# Patient Record
Sex: Female | Born: 1970 | Race: White | Hispanic: Yes | Marital: Married | Smoking: Never smoker
Health system: Southern US, Community
[De-identification: ages and names within clinical notes are randomized; demographics above are authoritative.]

## PROBLEM LIST (undated history)

## (undated) ENCOUNTER — Emergency Department (HOSPITAL_COMMUNITY): Admission: EM | Payer: Self-pay

## (undated) DIAGNOSIS — D259 Leiomyoma of uterus, unspecified: Secondary | ICD-10-CM

## (undated) HISTORY — PX: UTERINE FIBROID SURGERY: SHX826

---

## 2001-02-07 ENCOUNTER — Encounter: Admission: RE | Admit: 2001-02-07 | Discharge: 2001-02-07 | Payer: Self-pay | Admitting: Obstetrics & Gynecology

## 2007-01-23 ENCOUNTER — Other Ambulatory Visit: Admission: RE | Admit: 2007-01-23 | Discharge: 2007-01-23 | Payer: Self-pay | Admitting: Gynecology

## 2007-02-21 ENCOUNTER — Encounter (INDEPENDENT_AMBULATORY_CARE_PROVIDER_SITE_OTHER): Payer: Self-pay | Admitting: Specialist

## 2007-02-21 ENCOUNTER — Inpatient Hospital Stay (HOSPITAL_COMMUNITY): Admission: RE | Admit: 2007-02-21 | Discharge: 2007-02-23 | Payer: Self-pay | Admitting: Gynecology

## 2008-07-19 ENCOUNTER — Other Ambulatory Visit: Admission: RE | Admit: 2008-07-19 | Discharge: 2008-07-19 | Payer: Self-pay | Admitting: Gynecology

## 2008-07-19 ENCOUNTER — Ambulatory Visit: Payer: Self-pay | Admitting: Gynecology

## 2008-07-19 ENCOUNTER — Encounter: Payer: Self-pay | Admitting: Gynecology

## 2008-09-04 ENCOUNTER — Ambulatory Visit: Payer: Self-pay | Admitting: Gynecology

## 2011-03-26 NOTE — Discharge Summary (Signed)
Rebecca Moss, Rebecca Moss                ACCOUNT NO.:  [id number]   MEDICAL RECORD NO.:  [id number]          PATIENT TYPE:  INP   LOCATION:  9312                          [facility]ILITY[facility]   PHYSICIAN:  Juan H. Lily Peer, M.D.DATE OF BIRTH:  1971/03/21   DATE OF ADMISSION:  02/21/2007  DATE OF DISCHARGE:  02/23/2007                               DISCHARGE SUMMARY   HISTORY OF PRESENT ILLNESS:  The patient is a 40 year old gravida 0 with  symptomatic leiomyomatous uteri and primary infertility who underwent  abdominal myomectomy and lysis of pelvic adhesions and chromopertubation  on February 21, 2007. Fifteen fibroids were removed and the patient had  some moderate adhesions in the cul-de-sac and surface left ovarian  endometriosis.  The patient did well postoperatively on the first 24  hours. The patient had adequate urinary output. Her hemoglobin and  hematocrit were 11.1 and 31.6 respectively with a platelet count  265,000.  The patient was afebrile.  She started on liquid diet and was  advanced to a regular diet, was up and ambulating and passing flatus by  her second day and was ready to be discharged home and incision site was  intact.   FINAL DIAGNOSIS:  1. Leiomyomatous uteri.  2. Primary infertility.  3. Pelvic adhesions.  4. Left ovarian endometriosis.  5. Surgical anemia.   PROCEDURE PERFORMED:  1. Abdominal myomectomy.  2. Lysis of pelvic adhesions.  3. Ablation of left ovarian endometriotic implants.   DISPOSITION:  Follow-up; the patient was discharged home on the second  postoperative day. She was up and ambulating, tolerating regular diet  well and she had subcuticular stitches placed. She will return back to  the office in 2 weeks for postop visit.  She was given a prescription  for Tylox to take one p.o. q.4-6 h p.r.n. pain.  Also Motrin 800 mg take  one p.o. t.i.d. p.r.n. and she was instructed start taking iron  supplementation 1 tablet daily and will check a CBC at a  postop visit.  All instructions were provided in Spanish as well.      Juan H. Lily Peer, M.D.  Electronically Signed     JHF/MEDQ  D:  02/23/2007  T:  02/23/2007  Job:  [id number]

## 2011-03-26 NOTE — Op Note (Signed)
Rebecca Moss, Rebecca Moss                ACCOUNT NO.:  [id number]   MEDICAL RECORD NO.:  [id number]          PATIENT TYPE:  INP   LOCATION:  9312                          [facility]   PHYSICIAN:  Juan H. Lily Peer, M.D.DATE OF BIRTH:  1971/04/28   DATE OF PROCEDURE:  02/21/2007  DATE OF DISCHARGE:                               OPERATIVE REPORT   INDICATIONS FOR OPERATION:  A 40 year old gravida 0 with symptomatic  leiomyomata uteri and primary infertility.  The patient with prior HSG  in [place] 57 years ago, which demonstrated tubal patency.  The patient's  preoperative hemoglobin was 13.7, hematocrit 40.4.   SURGEON:  Juan H. Lily Peer, MD   FIRST ASSISTANT:  Colin Broach, MD   PREOPERATIVE DIAGNOSIS:  Symptomatic leiomyomata uteri.   POSTOPERATIVE DIAGNOSIS:  Symptomatic leiomyomata uteri.   PROCEDURES PERFORMED:  1. Abdominal myomectomy.  2. Lysis of pelvic adhesions.  3. Chromopertubation.   FINDINGS:  Patient with multiple intramural and subserosal leiomyomas.  Chromopertubation demonstrated bilateral tubal patency.  Right ovary  appeared normal.  The left ovary with surface endometriotic implant.  The cul-de-sac was slightly obliterated with pelvic adhesions.   DESCRIPTION OF OPERATION:  After the patient was adequately counseled,  she was taken to the operating room, where she underwent a successful  general endotracheal anesthesia.  She had received a gram of cefoxitin  for prophylaxis and had PSA stockings for DVT prophylaxis.  The legs  were placed initially in the high lithotomy position, whereby a Cohen  cannula was placed attached to a single-tooth tenaculum and attached to  a tubing with a syringe with methylene blue dye for the  chromopertubation.  The legs were then brought down.  A Foley catheter  was placed to monitor the patient's urinary output.  The abdomen had  been prepped and draped in the usual sterile fashion.  A Pfannenstiel  incision was made  2 cm above the symphysis pubis.  The incision was  carried down through the skin and subcutaneous tissue down to the rectus  fascia, whereby midline nick was made and the midline raphe was entered  cautiously.  O'Connor-O'Sullivan retractors were in place and  individually-wrapped gauze and plastic were used for packing to prevent  pelvic adhesions.  It was at this time that the multiple leiomyomas were  noted.  Pitressin was infiltrated on the serosa of the uterus in the  fundal aspect in the center between all the fibroids and staying away  from the tubal ostia.  Approximately 8 mL were infiltrated in a linear  fashion where the dissection was to be made.  The Pitressin dilution was  1 unit per 1 mL of saline (20 units of Pitressin in 20 mL of saline).  A  vertical incision was made at the fundus of the uterus and a total of 15  fibroids were enucleated and passed off the operative field.  The  intrauterine cavity was entered with removal one of the fibroids.  After  note, prior to the myomectomy chromopertubation had demonstrated  bilateral tubal patency.  Once the fibroids were removed, the  endometrial cavity that had been entered was reapproximated with  submucosal running stitch of 3-0 Vicryl suture and the muscularis was  closed with running stitches in several layers to approximate the  myometrium with 2-0 Vicryl suture.  The serosa was then closed with a  running stitch of 3-0 Vicryl suture.  The pelvic cavity was irrigated  with lactated Ringer's with heparin and after hemostasis was noted,  Interceed was placed over the incision site for additional prevention of  adhesions and, of note, the endometriotic implant on the left ovary had  been cauterized.  Sponge count and needle count were correct.  The  O'Connor-O'Sullivan retractor was removed.  The visceral peritoneum was  not reapproximated but the rectus fascia was closed with a running  stitch of 0 Vicryl suture and  subcutaneous bleeders were Bovie  cauterized.  The skin was reapproximated with a subcuticular stitch of 3-  0 Vicryl suture on a Keith needle, along with placement of half-inch  Steri-Strips and a pressure dressing.  The patient was extubated and  transferred to the recovery room with stable vital signs.  Blood loss  from the procedure was 300 mL.  IV fluids consisted of 1600  mL of  lactated Ringer's.  The urine output was 400 mL and clear.      Juan H. Lily Peer, M.D.  Electronically Signed     JHF/MEDQ  D:  02/21/2007  T:  02/21/2007  Job:  [id number]

## 2011-03-26 NOTE — H&P (Signed)
Rebecca Moss, Rebecca Moss                ACCOUNT NO.:  [id number]   MEDICAL RECORD NO.:  [id number]          PATIENT TYPE:  AMB   LOCATION:  SDC                           [facility]   PHYSICIAN:  Juan H. Lily Peer, M.D.DATE OF BIRTH:  1971/05/29   DATE OF ADMISSION:  DATE OF DISCHARGE:                              HISTORY & PHYSICAL   SURGERY SCHEDULED FOR 02/21/2007   CHIEF COMPLAINT:  Symptomatic leiomyomata uteri.   HISTORY:  The patient is a 40 year old gravida 0 who was seen in the  office as a new patient on March 17 for her annual exam that was long  overdue.  Her prior studies have been done in [place] and she states her  Pap smears have been normal.  Patient has had unprotected intercourse  for over the past 15 years.  She stated she had some form of infertility  evaluation in [place] whereby an HSG has been done and there was evidence  of tubal patency.  She states her cycles are every 25 days and during  her infertility evaluation and treatment it appears that she may have  received also intrauterine insemination and did not conceive.  Patient  during her examination was found to have a 14 week size uterus and for  this reason an ultrasound was ordered in the office which was done on  March 25 which demonstrated she had several fibroids.  One fibroid  measures 61 x 42 mm, 32 x 27 mm, 17 x 14 mm, 38 x 35 mm, 17 x 12 mm, 36  x 31 mm, 29 x 32 mm, 32 x 31 mm and 24 x 19 mm.  Degenerating myelomas  were noted.  The right ovary had a thick wall echo-free cyst measuring  23 x 18 mm and the left ovary had two thick wall cysts measuring 21 x 18  mm and 19 x 20 mm respectively with internal low-level echos.  Negative  color flow Doppler.  In total, patient had 9 fibroids scattered  throughout her uterus, two large ones anteriorly and the other large  ones more posteriorly located as well as several intramural.  Patient  was in the process of donating a unit of autologous blood.  When  she  went to donate her blood, they said her temperature was elevated and  when she came back to the office that day she was afebrile.  Since they  were only offering once a week, she could not go all the way [place], so  she was not able to donate autologous blood to have available in the  event that she were to need a blood transfusion.  She has been kept on  supplemental iron at home one tablet q daily.  Her last hemoglobin and  hematocrit were on March 28, demonstrating hemoglobin 13.7, hematocrit  of 40.4 with a platelet count of 325,000.  Patient scheduled to proceed  with an abdominal myomectomy before she proceeds with her fertility  desires.  I do believe that this is probably a major contributing factor  why she has been unable to conceive.  We will also do a  chromopertubation at time of her operation as well.   ALLERGIES:  She denies any allergies.   SOCIAL HISTORY:  She denies smoking or alcohol consumption.  Negative  family history of any malignancy reported.   MEDICATIONS:  She is currently on no medication.   PHYSICAL EXAMINATION:  GENERAL:  Patient weighs 143 lbs.  She is 5'3  tall.  HEENT:  Unremarkable.  NECK:  Supple.  Trachea midline.  No carotid bruits.  No thyromegaly.  LUNGS:  Clear to auscultation bilaterally without any rhonchi or  wheezes.  HEART:  Regular rate and rhythm.  No murmurs or gallops.  BREAST EXAM:  Not done.  ABDOMEN:  Soft, nontender without rebound or guarding.  PELVIC:  Bartholin, urethral and Skene's within normal limits.  VAGINA AND CERVIX:  No gross lesions on inspection.  Uterus 14 week size  and irregular.  RECTAL EXAM:  Unremarkable.   ASSESSMENT:  A 40 year old gravida 0 with symptomatic leiomyomata uteri  complaining of pressure sensation and primary infertility.  Scheduled to  undergo abdominal myomectomy.  The risks, benefits and pros and cons of  the operation were discussed including infection, although she will  receive  prophylactic antibiotic.  The risks for deep vein thrombosis and  subsequent pulmonary embolism were discussed as well.  Also if she were  to need a blood or a blood transfusion, she is fully aware of the  potential risks for anaphylactic reaction, hepatitis or AIDS and also  the potential risk for trauma to internal organs requiring corrective  surgery at that time and remotely in the event that is a life-saving  measure because of extreme hemorrhage, that she could potentially lose  her reproductive organs.  She is fully aware of this and accepts and we  will follow accordingly.   PLAN:  The patient is scheduled for abdominal myomectomy on Tuesday,  February 21, 2007 at 7:30 a.m. at [facility]      [place] H. Lily Peer, M.D.  Electronically Signed     JHF/MEDQ  D:  02/20/2007  T:  02/20/2007  Job:  [id number]

## 2012-08-11 ENCOUNTER — Ambulatory Visit: Payer: Self-pay | Admitting: Family Medicine

## 2012-08-11 VITALS — BP 110/68 | HR 71 | Temp 98.2°F | Resp 16 | Ht 61.0 in | Wt 161.0 lb

## 2012-08-11 DIAGNOSIS — Z124 Encounter for screening for malignant neoplasm of cervix: Secondary | ICD-10-CM

## 2012-08-11 DIAGNOSIS — R350 Frequency of micturition: Secondary | ICD-10-CM

## 2012-08-11 DIAGNOSIS — N898 Other specified noninflammatory disorders of vagina: Secondary | ICD-10-CM

## 2012-08-11 DIAGNOSIS — B373 Candidiasis of vulva and vagina: Secondary | ICD-10-CM

## 2012-08-11 LAB — POCT UA - MICROSCOPIC ONLY
Casts, Ur, LPF, POC: NEGATIVE
Crystals, Ur, HPF, POC: NEGATIVE
Mucus, UA: NEGATIVE

## 2012-08-11 LAB — POCT URINALYSIS DIPSTICK
Bilirubin, UA: NEGATIVE
Ketones, UA: NEGATIVE
pH, UA: 7

## 2012-08-11 LAB — POCT WET PREP WITH KOH

## 2012-08-11 MED ORDER — FLUCONAZOLE 150 MG PO TABS: 150.0000 mg | ORAL_TABLET | Freq: Once | ORAL | Status: DC

## 2012-08-11 NOTE — Progress Notes (Signed)
[facility] [address] [place] [address] [phone]  Date:  08/11/2012   Name:  Rebecca Moss   DOB:  1970-12-27   MRN:  [id number]  PCP:  No primary provider on file.    Chief Complaint: Gynecologic Exam   History of Present Illness:  Rebecca Moss is a 41 y.o. very pleasant female patient who presents with the following:  She would like to have a pap smear today.  She is concerned about vaginal itching and also has some discharge.  She also wants to be sure that she has not contracted any STI.  She is married but just wants to be sure.    LMP was 08/02/12.   She also recently had an ear infection and is taking amoxicillin.  Otherwise she is generally healthy   She notes that she has irritation and some discharge after sex.   LMP 08/02/12  She also has complaint of urinary frequency, but this seems to be more longstanding.  It is not clear if her current pattern is any change from her lifelong pattern of urinating about 4 times a day and 2 times at night.    There is no problem list on file for this patient.   No past medical history on file.  No past surgical history on file.  History  Substance Use Topics  . Smoking status: Never Smoker   . Smokeless tobacco: Not on file  . Alcohol Use: Not on file    No family history on file.  No Known Allergies  Medication list has been reviewed and updated.  Current Outpatient Prescriptions on File Prior to Visit  Medication Sig Dispense Refill  . fluticasone (FLONASE) 50 MCG/ACT nasal spray Place 2 sprays into the nose daily.        Review of Systems:  As per HPI- otherwise negative.   Physical Examination: Filed Vitals:   08/11/12 0921  BP: 110/68  Pulse: 71  Temp: 98.2 F (36.8 C)  Resp: 16   Filed Vitals:   08/11/12 0921  Height: 5\' 1"  (1.549 m)  Weight: 161 lb (73.029 kg)   Body mass index is 30.42 kg/(m^2). Ideal Body Weight: Weight in (lb) to have BMI = 25: 132   GEN:  WDWN, NAD, Non-toxic, A & O x 3 HEENT: Atraumatic, Normocephalic. Neck supple. No masses, No LAD. Ears and Nose: No external deformity. CV: RRR, No M/G/R. No JVD. No thrill. No extra heart sounds. PULM: CTA B, no wheezes, crackles, rhonchi. No retractions. No resp. distress. No accessory muscle use. ABD: S, NT, ND, +BS. No rebound. No HSM. EXTR: No c/c/e NEURO Normal gait.  PSYCH: Normally interactive. Conversant. Not depressed or anxious appearing.  Calm demeanor.  GU: there is copious thick white discharge that seems consistent with yeast vaginitis.  Otherwise no CMT, no adnexal masses or tenderness    Results for orders placed in visit on 08/11/12  POCT URINALYSIS DIPSTICK      Component Value Range   Color, UA yellow     Clarity, UA clear     Glucose, UA neg     Bilirubin, UA neg     Ketones, UA neg     Spec Grav, UA 1.010     Blood, UA trace lysed     pH, UA 7.0     Protein, UA neg     Urobilinogen, UA 0.2     Nitrite, UA neg     Leukocytes,  UA Trace    POCT UA - MICROSCOPIC ONLY      Component Value Range   WBC, Ur, HPF, POC 0-3     RBC, urine, microscopic 0-1     Bacteria, U Microscopic trace     Mucus, UA neg     Epithelial cells, urine per micros 8-10     Crystals, Ur, HPF, POC neg     Casts, Ur, LPF, POC neg     Yeast, UA neg    POCT WET PREP WITH KOH      Component Value Range   Trichomonas, UA Negative     Clue Cells Wet Prep HPF POC 6-8     Epithelial Wet Prep HPF POC 8-10     Yeast Wet Prep HPF POC positive     Bacteria Wet Prep HPF POC 3+     RBC Wet Prep HPF POC 3-5     WBC Wet Prep HPF POC 8-12     KOH Prep POC Positive       Assessment and Plan: 1. Vaginal Discharge  POCT Wet Prep with KOH  2. Screening for cervical cancer  Pap IG, CT/NG w/ reflex HPV when ASC-U  3. Urinary frequency  POCT urinalysis dipstick, POCT UA - Microscopic Only  4. Yeast vaginitis  fluconazole (DIFLUCAN) 150 MG tablet   She has an obvious vaginal yeast infection. Also  some clue cells, but I suspect yeast is responsible for her symptoms.  Will start with diflucan, and follow- up further with her pap result.    Abbe Amsterdam, MD

## 2012-08-11 NOTE — Patient Instructions (Signed)
Medicina for so vaginitis- diflucan   Vaginitis monilisica (Monilial Vaginitis) La vaginitis es una inflamacin (irritacin, hinchazn) de la vagina y la vulva. Esta no es una enfermedad de transmisin sexual.  CAUSAS Este tipo de vaginitis lo causa un hongo (candida) que normalmente se encuentra en la vagina. El hongo candida se ha desarrollado hasta el punto de ocasionar problemas en el equilibrio qumico. SNTOMAS  Secrecin vaginal espesa y blanca.   Hinchazn, picazn, enrojecimiento e inflamacin de la vagina y en algunos casos de los labios vaginales (vulva).   Ardor o dolor al [employer].   Dolor en las relaciones sexuales.  DIAGNSTICO Los factores que favorecen la vaginitis moniliasica son:  Everlean Patterson de virginidad y postmenopusicas.   Embarazo.   Infecciones.   Sentir cansancio, estar enferma o estresada, especialmente si ya ha sufrido este problema en el pasado.   Diabetes Buen control ayudar a disminur la probabilidad.   Pldoras anticonceptivas   Ropa interior [place].   El uso de espumas de bao, aerosoles femeninos duchas vaginales o tampones con desodorante.   Algunos antibiticos (medicamentos que destruyen grmenes).   Si contrae alguna enfermedad puede sufrir recurrencias espordicas.  TRATAMIENTO El profesional que lo asiste prescribir medicamentos.  Hay diferentes tipos de cremas y supositorios vaginales que tratan especficamente la vaginitis monilisica. Para infecciones por hongos recurrentes, utilice un supositorio o crema en la vagina dos veces por semana, o segn se le indique.   Tambin podrn utilizarse cremas con corticoides o anti monilisicas para la picazn o la irritacin de la vulva. Consulte con el profesional que la asiste.   Si la crema no da resultado, podr aplicarse en la vagina una solucin con azul de metileno.   El consumo de yogur puede prevenir este tipo de vaginitis.  INSTRUCCIONES PARA EL CUIDADO DOMICILIARIO  Tome  todos los medicamentos tal como se le indic.   No mantenga relaciones sexuales hasta que el tratamiento se haya completado, o segn las indicaciones del profesional que la asiste.   Tome baos de asiento tibios.   No se aplique duchas vaginales.   No utilice tampones, especialmente los perfumados.   Use ropa interior de algodn   [employer] pantalones ajustados y las medias tipo panty.   Comunique a sus compaeros sexuales que sufre una infeccin por hongos. Ellos deben concurrir para un control mdico si tienen sntomas como una urticaria leve o picazn.   Sus compaeros sexuales deben tratarse tambin si la infeccin es difcil de Pharmacologist.   Practique el sexo seguro - use condones   Algunos medicamentos vaginales ocasionan fallas en los condones de ltex. Los medicamentos vaginales que pueden daar los condones son:   Chiropodist cleocina   Butoconazole (Femstat)   Terconazole (Terazol) supositorios vaginales   Miconazole (Monistat) (es un medicamento de venta libre)  SOLICITE ATENCIN MDICA SI:  Daphane Shepherd tiene una temperatura oral de ms de 102 F (38.9 C).   Si la infeccin empeora luego de 2 [address]   Si la infeccin no mejora luego de 3 [address].   Aparecen ampollas en o alrededor de la vagina.   Si aparece una hemorragia vaginal y no es el momento del perodo.   Siente dolor al [employer].   Presenta problemas intestinales.   Tiene dolor durante las [employer].  Document Released: 08/04/2005 Document Revised: 10/14/2011 [facility] Patient Information 2012 [place], [place].

## 2012-08-15 ENCOUNTER — Encounter: Payer: Self-pay | Admitting: Family Medicine

## 2012-08-15 LAB — PAP IG, CT-NG, RFX HPV ASCU

## 2013-01-03 ENCOUNTER — Inpatient Hospital Stay (HOSPITAL_COMMUNITY)
Admission: EM | Admit: 2013-01-03 | Discharge: 2013-01-05 | DRG: 090 | Disposition: A | Discharge status: Home / Self Care | Payer: No Typology Code available for payment source | Attending: General Surgery | Admitting: General Surgery

## 2013-01-03 ENCOUNTER — Emergency Department (HOSPITAL_COMMUNITY): Payer: No Typology Code available for payment source

## 2013-01-03 ENCOUNTER — Inpatient Hospital Stay (HOSPITAL_COMMUNITY): Payer: No Typology Code available for payment source

## 2013-01-03 ENCOUNTER — Encounter (HOSPITAL_COMMUNITY): Payer: Self-pay | Admitting: Family Medicine

## 2013-01-03 DIAGNOSIS — S0083XA Contusion of other part of head, initial encounter: Secondary | ICD-10-CM

## 2013-01-03 DIAGNOSIS — IMO0002 Reserved for concepts with insufficient information to code with codable children: Secondary | ICD-10-CM | POA: Diagnosis present

## 2013-01-03 DIAGNOSIS — S060X0A Concussion without loss of consciousness, initial encounter: Principal | ICD-10-CM | POA: Diagnosis present

## 2013-01-03 DIAGNOSIS — S1093XA Contusion of unspecified part of neck, initial encounter: Secondary | ICD-10-CM

## 2013-01-03 DIAGNOSIS — S060X9A Concussion with loss of consciousness of unspecified duration, initial encounter: Secondary | ICD-10-CM

## 2013-01-03 DIAGNOSIS — S0003XA Contusion of scalp, initial encounter: Secondary | ICD-10-CM | POA: Diagnosis present

## 2013-01-03 DIAGNOSIS — Z86018 Personal history of other benign neoplasm: Secondary | ICD-10-CM

## 2013-01-03 HISTORY — DX: Leiomyoma of uterus, unspecified: D25.9

## 2013-01-03 LAB — POCT I-STAT TROPONIN I: Troponin i, poc: 0 ng/mL (ref 0.00–0.08)

## 2013-01-03 LAB — COMPREHENSIVE METABOLIC PANEL
ALT: 18 U/L (ref 0–35)
AST: 18 U/L (ref 0–37)
Albumin: 3.9 g/dL (ref 3.5–5.2)
CO2: 21 mEq/L (ref 19–32)
Calcium: 9.1 mg/dL (ref 8.4–10.5)
Creatinine, Ser: 0.64 mg/dL (ref 0.50–1.10)
Sodium: 136 mEq/L (ref 135–145)
Total Protein: 7.1 g/dL (ref 6.0–8.3)

## 2013-01-03 LAB — CBC
HCT: 40.6 % (ref 36.0–46.0)
Hemoglobin: 14.2 g/dL (ref 12.0–15.0)
MCH: 30.3 pg (ref 26.0–34.0)
MCHC: 35 g/dL (ref 30.0–36.0)
MCV: 86.8 fL (ref 78.0–100.0)
RBC: 4.68 MIL/uL (ref 3.87–5.11)

## 2013-01-03 LAB — POCT I-STAT, CHEM 8
BUN: 15 mg/dL (ref 6–23)
Creatinine, Ser: 0.6 mg/dL (ref 0.50–1.10)
Potassium: 4 mEq/L (ref 3.5–5.1)
Sodium: 139 mEq/L (ref 135–145)
TCO2: 22 mmol/L (ref 0–100)

## 2013-01-03 LAB — CG4 I-STAT (LACTIC ACID): Lactic Acid, Venous: 2.88 mmol/L — ABNORMAL HIGH (ref 0.5–2.2)

## 2013-01-03 LAB — MRSA PCR SCREENING: MRSA by PCR: NEGATIVE

## 2013-01-03 LAB — SAMPLE TO BLOOD BANK

## 2013-01-03 LAB — CDS SEROLOGY

## 2013-01-03 MED ORDER — POTASSIUM CHLORIDE IN NACL 20-0.9 MEQ/L-% IV SOLN
INTRAVENOUS | Status: DC
  Administered 2013-01-03 – 2013-01-04 (×2): via INTRAVENOUS
  Filled 2013-01-03 (×8): qty 1000

## 2013-01-03 MED ORDER — ONDANSETRON HCL 4 MG/2ML IJ SOLN
INTRAMUSCULAR | Status: AC
  Administered 2013-01-03: 08:00:00
  Filled 2013-01-03: qty 2

## 2013-01-03 MED ORDER — MORPHINE SULFATE 4 MG/ML IJ SOLN
INTRAMUSCULAR | Status: AC
  Filled 2013-01-03: qty 1

## 2013-01-03 MED ORDER — ONDANSETRON HCL 4 MG/2ML IJ SOLN
4.0000 mg | Freq: Four times a day (QID) | INTRAMUSCULAR | Status: DC | PRN
  Administered 2013-01-03: 4 mg via INTRAVENOUS
  Filled 2013-01-03: qty 2

## 2013-01-03 MED ORDER — ONDANSETRON HCL 4 MG PO TABS: 4.0000 mg | ORAL_TABLET | Freq: Four times a day (QID) | ORAL | Status: DC | PRN

## 2013-01-03 MED ORDER — IOHEXOL 300 MG/ML  SOLN
100.0000 mL | Freq: Once | INTRAMUSCULAR | Status: AC | PRN
  Administered 2013-01-03: 100 mL via INTRAVENOUS

## 2013-01-03 MED ORDER — MORPHINE SULFATE 2 MG/ML IJ SOLN
1.0000 mg | INTRAMUSCULAR | Status: DC | PRN
  Administered 2013-01-03: 2 mg via INTRAVENOUS
  Filled 2013-01-03: qty 1

## 2013-01-03 NOTE — H&P (Signed)
Patient examined and I agree with the assessment and plan Patient evaluated together and I spoke to her husband and other family members in Spanish to discuss the plan of care and answer their questions. Violeta Gelinas, MD, MPH, FACS Pager: [phone]  01/03/2013 11:49 AM
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
## 2013-01-03 NOTE — ED Notes (Signed)
Pt vomited again after moving from ct table.
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
## 2013-01-03 NOTE — ED Notes (Signed)
Trauma ended by mistake

## 2013-01-03 NOTE — ED Notes (Signed)
Pt transported to CT ?
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
## 2013-01-03 NOTE — ED Notes (Signed)
Pt has abrasions to right forearm, shin and right foot. Lateral bruising to right thigh. FAST normal

## 2013-01-04 ENCOUNTER — Inpatient Hospital Stay (HOSPITAL_COMMUNITY): Payer: No Typology Code available for payment source

## 2013-01-04 LAB — BASIC METABOLIC PANEL
BUN: 9 mg/dL (ref 6–23)
Creatinine, Ser: 0.71 mg/dL (ref 0.50–1.10)
GFR calc Af Amer: >90 mL/min (ref >90)
GFR calc non Af Amer: >90 mL/min (ref >90)
Potassium: 4 mEq/L (ref 3.5–5.1)

## 2013-01-04 LAB — CBC
HCT: 38.5 % (ref 36.0–46.0)
MCHC: 35.1 g/dL (ref 30.0–36.0)
MCV: 87.5 fL (ref 78.0–100.0)
Platelets: 293 10*3/uL (ref 150–400)
RDW: 12.8 % (ref 11.5–15.5)
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
## 2013-01-05 LAB — BASIC METABOLIC PANEL
GFR calc Af Amer: >90 mL/min (ref >90)
GFR calc non Af Amer: >90 mL/min (ref >90)
Potassium: 3.9 mEq/L (ref 3.5–5.1)
Sodium: 137 mEq/L (ref 135–145)

## 2013-01-05 LAB — CBC
MCHC: 33.9 g/dL (ref 30.0–36.0)
RDW: 12.7 % (ref 11.5–15.5)

## 2013-01-05 MED ORDER — IOHEXOL 300 MG/ML  SOLN: 100.0000 mL | Freq: Once | INTRAMUSCULAR | Status: DC | PRN
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
## 2013-01-25 ENCOUNTER — Encounter (HOSPITAL_COMMUNITY): Payer: Self-pay

## 2013-01-25 ENCOUNTER — Emergency Department (INDEPENDENT_AMBULATORY_CARE_PROVIDER_SITE_OTHER)
Admission: EM | Admit: 2013-01-25 | Discharge: 2013-01-25 | Disposition: A | Discharge status: Home / Self Care | Payer: Self-pay | Source: Home / Self Care

## 2013-01-25 MED ORDER — IBUPROFEN 800 MG PO TABS: 800.0000 mg | ORAL_TABLET | Freq: Three times a day (TID) | ORAL | Status: DC | PRN
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
## 2014-07-11 ENCOUNTER — Ambulatory Visit (INDEPENDENT_AMBULATORY_CARE_PROVIDER_SITE_OTHER): Payer: BC Managed Care – PPO

## 2014-07-11 ENCOUNTER — Ambulatory Visit (INDEPENDENT_AMBULATORY_CARE_PROVIDER_SITE_OTHER): Payer: BC Managed Care – PPO | Admitting: Family Medicine

## 2014-07-11 VITALS — BP 100/65 | HR 60 | Temp 98.2°F | Resp 16 | Ht 62.25 in | Wt 162.8 lb

## 2014-07-11 DIAGNOSIS — Z789 Other specified health status: Secondary | ICD-10-CM

## 2014-07-11 DIAGNOSIS — R079 Chest pain, unspecified: Secondary | ICD-10-CM

## 2014-07-11 DIAGNOSIS — R109 Unspecified abdominal pain: Secondary | ICD-10-CM

## 2014-07-11 DIAGNOSIS — M545 Low back pain, unspecified: Secondary | ICD-10-CM

## 2014-07-11 DIAGNOSIS — Z603 Acculturation difficulty: Secondary | ICD-10-CM

## 2014-07-11 LAB — POCT CBC
GRANULOCYTE PERCENT: 57.8 % (ref 37–80)
HCT, POC: 41.1 % (ref 37.7–47.9)
Hemoglobin: 13.9 g/dL (ref 12.2–16.2)
Lymph, poc: 2.2 (ref 0.6–3.4)
MCH, POC: 30.2 pg (ref 27–31.2)
MCHC: 33.7 g/dL (ref 31.8–35.4)
MCV: 89.7 fL (ref 80–97)
MID (CBC): 0.4 (ref 0–0.9)
MPV: 7.4 fL (ref 0–99.8)
PLATELET COUNT, POC: 301 10*3/uL (ref 142–424)
POC GRANULOCYTE: 3.6 (ref 2–6.9)
POC LYMPH PERCENT: 35.5 %L (ref 10–50)
POC MID %: 6.7 %M (ref 0–12)
RBC: 4.59 M/uL (ref 4.04–5.48)
RDW, POC: 13 %
WBC: 6.3 10*3/uL (ref 4.6–10.2)

## 2014-07-11 LAB — COMPREHENSIVE METABOLIC PANEL
ALT: 21 U/L (ref 0–35)
AST: 17 U/L (ref 0–37)
Albumin: 4.4 g/dL (ref 3.5–5.2)
Alkaline Phosphatase: 59 U/L (ref 39–117)
BUN: 11 mg/dL (ref 6–23)
CALCIUM: 9.1 mg/dL (ref 8.4–10.5)
CHLORIDE: 106 meq/L (ref 96–112)
CO2: 28 meq/L (ref 19–32)
CREATININE: 0.66 mg/dL (ref 0.50–1.10)
Glucose, Bld: 100 mg/dL — ABNORMAL HIGH (ref 70–99)
POTASSIUM: 4 meq/L (ref 3.5–5.3)
SODIUM: 139 meq/L (ref 135–145)
TOTAL PROTEIN: 6.9 g/dL (ref 6.0–8.3)
Total Bilirubin: 0.4 mg/dL (ref 0.2–1.2)

## 2014-07-11 LAB — POCT URINALYSIS DIPSTICK
BILIRUBIN UA: NEGATIVE
GLUCOSE UA: NEGATIVE
KETONES UA: NEGATIVE
LEUKOCYTES UA: NEGATIVE
Nitrite, UA: NEGATIVE
Protein, UA: NEGATIVE
SPEC GRAV UA: 1.015
Urobilinogen, UA: 0.2
pH, UA: 6

## 2014-07-11 LAB — POCT URINE PREGNANCY: PREG TEST UR: NEGATIVE

## 2014-07-11 LAB — POCT UA - MICROSCOPIC ONLY
BACTERIA, U MICROSCOPIC: NEGATIVE
CRYSTALS, UR, HPF, POC: NEGATIVE
Casts, Ur, LPF, POC: NEGATIVE
WBC, UR, HPF, POC: NEGATIVE
Yeast, UA: NEGATIVE

## 2014-07-11 MED ORDER — METHOCARBAMOL 500 MG PO TABS: 500.0000 mg | ORAL_TABLET | Freq: Three times a day (TID) | ORAL | Status: DC | PRN
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
## 2014-07-11 NOTE — Patient Instructions (Addendum)
I will be in touch with the rest of your lab when they come back.   We will set you up to see cardiology to make sure all is ok with your heart So far your x-rays, labs, and EKG are reassuring.   You can use the flonase spray for your nose as needed ,and try the robaxin as needed for back pain. If anything changes or gets worse please seek care

## 2014-07-15 ENCOUNTER — Telehealth: Payer: Self-pay | Admitting: Family Medicine

## 2014-07-15 ENCOUNTER — Encounter: Payer: Self-pay | Admitting: Family Medicine

## 2014-07-15 NOTE — Telephone Encounter (Signed)
Called to go over labs and x-ray. Explained that her x-rays show that she is constipated but otherwise all ok.  She will try some miralax OTC, and will let me know if sx not resolved . However she is feeling better  Results for orders placed in visit on 07/11/14  COMPREHENSIVE METABOLIC PANEL      Result Value Ref Range   Sodium 139  135 - 145 mEq/L   Potassium 4.0  3.5 - 5.3 mEq/L   Chloride 106  96 - 112 mEq/L   CO2 28  19 - 32 mEq/L   Glucose, Bld 100 (*) 70 - 99 mg/dL   BUN 11  6 - 23 mg/dL   Creat 0.66  0.50 - 1.10 mg/dL   Total Bilirubin 0.4  0.2 - 1.2 mg/dL   Alkaline Phosphatase 59  39 - 117 U/L   AST 17  0 - 37 U/L   ALT 21  0 - 35 U/L   Total Protein 6.9  6.0 - 8.3 g/dL   Albumin 4.4  3.5 - 5.2 g/dL   Calcium 9.1  8.4 - 10.5 mg/dL  POCT CBC      Result Value Ref Range   WBC 6.3  4.6 - 10.2 K/uL   Lymph, poc 2.2  0.6 - 3.4   POC LYMPH PERCENT 35.5  10 - 50 %L   MID (cbc) 0.4  0 - 0.9   POC MID % 6.7  0 - 12 %M   POC Granulocyte 3.6  2 - 6.9   Granulocyte percent 57.8  37 - 80 %G   RBC 4.59  4.04 - 5.48 M/uL   Hemoglobin 13.9  12.2 - 16.2 g/dL   HCT, POC 41.1  37.7 - 47.9 %   MCV 89.7  80 - 97 fL   MCH, POC 30.2  27 - 31.2 pg   MCHC 33.7  31.8 - 35.4 g/dL   RDW, POC 13.0     Platelet Count, POC 301  142 - 424 K/uL   MPV 7.4  0 - 99.8 fL  POCT UA - MICROSCOPIC ONLY      Result Value Ref Range   WBC, Ur, HPF, POC neg     RBC, urine, microscopic 0-1     Bacteria, U Microscopic neg     Mucus, UA trace     Epithelial cells, urine per micros 0-3     Crystals, Ur, HPF, POC neg     Casts, Ur, LPF, POC neg     Yeast, UA neg    POCT URINALYSIS DIPSTICK      Result Value Ref Range   Color, UA yellow     Clarity, UA clear     Glucose, UA neg     Bilirubin, UA neg     Ketones, UA neg     Spec Grav, UA 1.015     Blood, UA trace     pH, UA 6.0     Protein, UA neg     Urobilinogen, UA 0.2     Nitrite, UA neg     Leukocytes, UA Negative    POCT URINE PREGNANCY    Result Value Ref Range   Preg Test, Ur Negative

## 2014-08-14 ENCOUNTER — Ambulatory Visit (INDEPENDENT_AMBULATORY_CARE_PROVIDER_SITE_OTHER): Payer: BC Managed Care – PPO | Admitting: Cardiology

## 2014-08-14 ENCOUNTER — Encounter: Payer: Self-pay | Admitting: Cardiology

## 2014-08-14 VITALS — BP 110/70 | HR 72 | Ht 62.25 in | Wt 166.0 lb

## 2014-08-14 DIAGNOSIS — R0609 Other forms of dyspnea: Secondary | ICD-10-CM

## 2014-08-14 DIAGNOSIS — R0600 Dyspnea, unspecified: Secondary | ICD-10-CM

## 2014-08-14 DIAGNOSIS — R079 Chest pain, unspecified: Secondary | ICD-10-CM

## 2014-08-14 NOTE — Patient Instructions (Signed)
Your physician recommends that you continue on your current medications as directed. Please refer to the Current Medication list given to you today.   Your physician has requested that you have an exercise tolerance test. For further information please visit [email or web address]. Please also follow instruction sheet, as given.   Your physician recommends that you schedule a follow-up appointment in: [place]

## 2014-08-14 NOTE — Progress Notes (Signed)
Patient ID: Rebecca Moss, female   DOB: October 05, 1971, 43 y.o.   MRN: [id number]    Patient Name: Rebecca Moss Date of Encounter: 08/14/2014  Primary Care Provider:  Pcp Not In System Primary Cardiologist:  Dorothy Spark  Problem List   Past Medical History  Diagnosis Date  . Uterine fibroid    Past Surgical History  Procedure Laterality Date  . Uterine fibroid surgery      Allergies  No Known Allergies  HPI  A 43 year old female with no past medical history who is coming with concerns of progressively worsening dyspnea on exertion. The patient denies any chest pain, palpitations or syncope. She states that walking upstairs is becoming harder. She denies orthopnea, PND. She has never smoked. No FH of CAD. NO h/o DM, HTN. She has [facility] and it was normal. She is not involved in any physical activity. 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   Home Medications  Prior to Admission medications   Not on File    Family History  No family history on file.  Social History  History   Social History  . Marital Status: Married    Spouse Name: N/A    Number of Children: N/A  . Years of Education: N/A   Occupational History  . Not on file.   Social History Main Topics  . Smoking status: Never Smoker   . Smokeless tobacco: Not on file  . Alcohol Use: No  . Drug Use: Not on file  . Sexual Activity: Not on file   Other Topics Concern  . Not on file   Social History Narrative   ** Merged History Encounter **         Review of Systems, as per HPI, otherwise negative General:  No chills, fever, night sweats or weight changes.  Cardiovascular:  No chest pain, dyspnea on exertion, edema, orthopnea, palpitations, paroxysmal nocturnal dyspnea. Dermatological: No rash, lesions/masses Respiratory: No cough, dyspnea Urologic: No hematuria, dysuria Abdominal:   No nausea, vomiting, diarrhea, bright red blood per rectum,  melena, or hematemesis Neurologic:  No visual changes, wkns, changes in mental status. All other systems reviewed and are otherwise negative except as noted above.  Physical Exam  Blood pressure 110/70, pulse 72, height 5' 2.25" (1.581 m), weight 166 lb (75.297 kg).  General: Pleasant, NAD Psych: Normal affect. Neuro: Alert and oriented X 3. Moves all extremities spontaneously. HEENT: Normal  Neck: Supple without bruits or JVD. Lungs:  Resp regular and unlabored, CTA. Heart: RRR no s3, s4, or murmurs. Abdomen: Soft, non-tender, non-distended, BS + x 4.  Extremities: No clubbing, cyanosis or edema. DP/PT/Radials 2+ and equal bilaterally.  Labs:  No results found for this basename: CKTOTAL, CKMB, TROPONINI,  in the last 72 hours Lab Results  Component Value Date   WBC 6.3 07/11/2014   HGB 13.9 07/11/2014   HCT 41.1 07/11/2014   MCV 89.7 07/11/2014   PLT 292 01/05/2013    No results found for this basename: DDIMER   No components found with this basename: POCBNP,     Component Value Date/Time   NA 139 07/11/2014 1005   K 4.0 07/11/2014 1005   CL 106 07/11/2014 1005   CO2 28 07/11/2014 1005   GLUCOSE 100* 07/11/2014 1005   BUN 11 07/11/2014 1005   CREATININE 0.66 07/11/2014 1005   CREATININE 0.56 01/05/2013 0555   CALCIUM 9.1 07/11/2014 1005  PROT 6.9 07/11/2014 1005   ALBUMIN 4.4 07/11/2014 1005   AST 17 07/11/2014 1005   ALT 21 07/11/2014 1005   ALKPHOS 59 07/11/2014 1005   BILITOT 0.4 07/11/2014 1005   GFRNONAA >90 01/05/2013 0555   GFRAA >90 01/05/2013 0555   No results found for this basename: CHOL, HDL, LDLCALC, TRIG    Accessory Clinical Findings  Echocardiogram - none  ECG -07/11/2014 - SB, borderline low voltage    Assessment & Plan  43 year old female with progressively worsening DOE. No PMH< no risk factors.  We will order an exercise tolerance stress test to evaluate functional capacity and to rule out ischemia.  Baseline ECG is normal.   Follow up as needed.   Dorothy Spark, MD, [facility] 08/14/2014, 10:40 AM

## 2014-08-15 DIAGNOSIS — R0609 Other forms of dyspnea: Secondary | ICD-10-CM | POA: Insufficient documentation

## 2014-08-15 DIAGNOSIS — R0600 Dyspnea, unspecified: Secondary | ICD-10-CM | POA: Insufficient documentation

## 2014-09-10 ENCOUNTER — Ambulatory Visit (INDEPENDENT_AMBULATORY_CARE_PROVIDER_SITE_OTHER): Payer: BC Managed Care – PPO | Admitting: Nurse Practitioner

## 2014-09-10 ENCOUNTER — Encounter: Payer: Self-pay | Admitting: Nurse Practitioner

## 2014-09-10 ENCOUNTER — Encounter: Payer: Self-pay | Admitting: Family Medicine

## 2014-09-10 VITALS — BP 108/71 | HR 73

## 2014-09-10 DIAGNOSIS — R0600 Dyspnea, unspecified: Secondary | ICD-10-CM

## 2014-09-10 DIAGNOSIS — R079 Chest pain, unspecified: Secondary | ICD-10-CM

## 2014-09-10 NOTE — Progress Notes (Signed)
Exercise Treadmill Test  Pre-Exercise Testing Evaluation Rhythm: normal sinus  Rate: 71 bpm     Test  Exercise Tolerance Test Ordering MD: Ena Dawley, MD  Interpreting MD: Truitt Merle, NP  Unique Test No: 1  Treadmill:  1  Indication for ETT: DOE Contraindication to ETT: No   Stress Modality: exercise - treadmill  Cardiac Imaging Performed: non   Protocol: standard Bruce - maximal  Max BP:  159/68  Max MPHR (bpm):  178 85% MPR (bpm):  151  MPHR obtained (bpm):  169 % MPHR obtained:  93%  Reached 85% MPHR (min:sec):  6:39 Total Exercise Time (min-sec):  8 minutes  Workload in METS:  10.1 Borg Scale: 17  Reason ETT Terminated:  patient's desire to stop    ST Segment Analysis At Rest: normal ST segments - no evidence of significant ST depression With Exercise: no evidence of significant ST depression  Other Information Arrhythmia:  No Angina during ETT:  absent (0) Quality of ETT:  diagnostic  ETT Interpretation:  normal - no evidence of ischemia by ST analysis  Comments: Patient presents today for routine GXT. Has had shortness of breath. No chest pain.    Today the patient exercised on the standard Bruce protocol for a total of 8 minutes.  Good exercise tolerance.  Adequate blood pressure response.  Clinically negative for chest pain. Test was stopped due to achievement of target HR.  EKG negative for ischemia. No significant arrhythmia noted. Very rare PVC noted.   Recommendations: CV risk factor modification  See back prn.  Patient is agreeable to this plan and will call if any problems develop in the interim.   Burtis Junes, RN, [place] [address] [place], [place]  [address] [phone]

## 2014-09-10 NOTE — Progress Notes (Signed)
Normal stress test, no evidence of ischemia.

## 2014-09-10 NOTE — Progress Notes (Signed)
Notified the pt of normal stress test, no evidence of ischemia per Dr Meda Coffee.  Pt verbalized understanding.

## 2016-04-10 ENCOUNTER — Telehealth: Payer: Self-pay | Admitting: Radiology

## 2016-04-10 ENCOUNTER — Ambulatory Visit (HOSPITAL_COMMUNITY)
Admission: AD | Admit: 2016-04-10 | Discharge: 2016-04-10 | Disposition: A | Discharge status: Home / Self Care | Payer: Self-pay | Source: Ambulatory Visit

## 2016-04-10 ENCOUNTER — Ambulatory Visit (INDEPENDENT_AMBULATORY_CARE_PROVIDER_SITE_OTHER): Payer: Self-pay | Admitting: Physician Assistant

## 2016-04-10 ENCOUNTER — Ambulatory Visit (HOSPITAL_COMMUNITY)
Admission: RE | Admit: 2016-04-10 | Discharge: 2016-04-10 | Disposition: A | Discharge status: Home / Self Care | Payer: Self-pay | Source: Ambulatory Visit | Attending: Physician Assistant | Admitting: Physician Assistant

## 2016-04-10 ENCOUNTER — Encounter: Payer: Self-pay | Admitting: Physician Assistant

## 2016-04-10 VITALS — BP 134/80 | HR 82 | Temp 98.5°F | Resp 16 | Ht 63.0 in | Wt 176.0 lb

## 2016-04-10 DIAGNOSIS — S060X1A Concussion with loss of consciousness of 30 minutes or less, initial encounter: Secondary | ICD-10-CM

## 2016-04-10 DIAGNOSIS — S0003XA Contusion of scalp, initial encounter: Secondary | ICD-10-CM | POA: Insufficient documentation

## 2016-04-10 DIAGNOSIS — W11XXXA Fall on and from ladder, initial encounter: Secondary | ICD-10-CM | POA: Insufficient documentation

## 2016-04-10 LAB — POCT URINE PREGNANCY: PREG TEST UR: NEGATIVE

## 2016-04-10 NOTE — Telephone Encounter (Signed)
Patient came in today / I sent her for stat CT scan. I have pulled up [employer] and could not get a prior authorization, patient does not appear to have active coverage. She has gone for CT Scan, urgent,STAT today, she fell with LOC / She did not have a recent insurance card. We may have to look at this retroactively to get an approval. Can not call, as it is Saturday

## 2016-04-10 NOTE — Progress Notes (Signed)
04/10/2016 11:11 AM   DOB: [birth year] / MRN: [id number]  SUBJECTIVE:  Rebecca Moss is a 45 y.o. female presenting for as head injury in which she fell off of a ladder and hit her head on the floor.  She reports a brief LOC however no one witnessed the fall.  Associates nausea which has resolved since she has been here today. Feels very fatigued.  She denies feelings of weakness.   She has No Known Allergies.   She  has a past medical history of Uterine fibroid.    She  reports that she has never smoked. She does not have any smokeless tobacco history on file. She reports that she does not drink alcohol. She  has no sexual activity history on file. The patient  has past surgical history that includes Uterine fibroid surgery.  Her family history is not on file.  Review of Systems  Constitutional: Negative for fever and chills.  Eyes: Negative for blurred vision.  Gastrointestinal: Positive for nausea.  Musculoskeletal: Negative for myalgias.  Skin: Negative for rash.  Neurological: Positive for dizziness, loss of consciousness and headaches.    Problem list and medications reviewed and updated by myself where necessary, and exist elsewhere in the encounter.   OBJECTIVE:  BP 134/80 mmHg  Pulse 82  Temp(Src) 98.5 F (36.9 C)  Resp 16  Ht 5\' 3"  (1.6 m)  Wt 176 lb (79.833 kg)  BMI 31.18 kg/m2  SpO2 98%  LMP  (LMP Unknown)  Physical Exam  Constitutional: She is oriented to person, place, and time. She appears well-nourished. No distress.  HENT:  Head:    Right Ear: No hemotympanum.  Left Ear: No hemotympanum.  Nose: Nose normal. No epistaxis.    Mouth/Throat: Uvula is midline, oropharynx is clear and moist and mucous membranes are normal.  Eyes: EOM are normal. Pupils are equal, round, and reactive to light.  Cardiovascular: Normal rate.   Pulmonary/Chest: Effort normal.  Abdominal: She exhibits no distension.  Musculoskeletal:       Cervical back: She exhibits normal  range of motion and no tenderness.  Neurological: She is alert and oriented to person, place, and time. She has normal reflexes. She displays no atrophy and no tremor. No cranial nerve deficit or sensory deficit. She exhibits normal muscle tone. She displays no seizure activity. Coordination and gait normal. GCS eye subscore is 4. GCS verbal subscore is 5. GCS motor subscore is 6.  Attention intact.  She could not remember the current president.  Oriented to person, place and time.   Skin: Skin is dry. She is not diaphoretic. There is erythema.  Psychiatric: She has a normal mood and affect.  Vitals reviewed.   Results for orders placed or performed in visit on 04/10/16 (from the past 72 hour(s))  POCT urine pregnancy     Status: None   Collection Time: 04/10/16 10:55 AM  Result Value Ref Range   Preg Test, Ur Negative Negative    No results found.  ASSESSMENT AND PLAN  Diagnoses and all orders for this visit:  Concussion, with loss of consciousness of 30 minutes or less, initial encounter: She fell from a height of about 2-3 stairs. She needs a CT scan today to rule out an acute intracranial process. Will CT her now at the [facility], advised family that if positive she will be going directly to the ED to access a neurologist.  -     CT Head Wo Contrast; Future -  POCT urine pregnancy    The patient was advised to call or return to clinic if she does not see an improvement in symptoms or to seek the care of the closest emergency department if she worsens with the above plan.   Philis Fendt, MHS, PA-C [facility] 04/10/2016 11:11 AM

## 2016-04-10 NOTE — Patient Instructions (Signed)
Go to [facility] today now for your CT Scan /  You will register as an outpatient for the scan in the Emergency department they will send you to Radiology Driving directions to [facility] [id number]  [phone]  - more info    [place], [place] [address]     1. Head south on [address] toward [address]      0.5 mi    2. Sharp left onto [address]      0.6 mi    3. Turn left onto the [address] ramp      0.2 mi    4. Merge onto [address]      3.0 mi    5. Continue straight to stay on [address]      0.4 mi    6. Slight left to stay on [address]      1.2 mi    7. Turn right onto [address]      0.1 mi    8. Turn left onto [address]      361 ft    9. Take the 1st left onto [address] will be on the right          IF you received an x-ray today, you will receive an invoice from [facility] Radiology. Please contact [facility] Radiology at [phone] with questions or concerns regarding your invoice.   IF you received labwork today, you will receive an invoice from [employer]. Please contact [facility] at [phone] with questions or concerns regarding your invoice.   Our billing staff will not be able to assist you with questions regarding bills from these companies.  You will be contacted with the lab results as soon as they are available. The fastest way to get your results is to activate your My Chart account. Instructions are located on the last page of this paperwork. If you have not heard from [place] regarding the results in 2 weeks, please contact this office.

## 2016-04-10 NOTE — Telephone Encounter (Signed)
Call report of CT scan no intracranial abnormality left parietal scalp hematoma, discussed with Philis Fendt PA -C patient is to use Ibuprofen 600 mg and take one week off work, advised her husband. He will pick up her work note.

## 2016-10-02 ENCOUNTER — Ambulatory Visit (INDEPENDENT_AMBULATORY_CARE_PROVIDER_SITE_OTHER): Payer: BLUE CROSS/BLUE SHIELD | Admitting: Family Medicine

## 2016-10-02 VITALS — BP 122/70 | HR 76 | Temp 98.5°F | Resp 18 | Ht 63.0 in | Wt 174.0 lb

## 2016-10-02 DIAGNOSIS — J069 Acute upper respiratory infection, unspecified: Secondary | ICD-10-CM | POA: Diagnosis not present

## 2016-10-02 DIAGNOSIS — J209 Acute bronchitis, unspecified: Secondary | ICD-10-CM

## 2016-10-02 MED ORDER — BENZONATATE 100 MG PO CAPS: 100.0000 mg | ORAL_CAPSULE | Freq: Three times a day (TID) | ORAL | 0 refills | Status: DC | PRN

## 2016-10-02 MED ORDER — HYDROCODONE-HOMATROPINE 5-1.5 MG/5ML PO SYRP: 5.0000 mL | ORAL_SOLUTION | ORAL | 0 refills | Status: DC | PRN

## 2016-10-02 MED ORDER — AMOXICILLIN 875 MG PO TABS: ORAL_TABLET | ORAL | 0 refills | Status: DC

## 2016-10-02 NOTE — Patient Instructions (Addendum)
  Drink plenty of fluids and get enough rest  Take the cough syrup, Hycodan, primarily at nighttime or when you are not working. 1 teaspoon every 4-6 hours. It does cause used to be sleepy.  Take the benzonatate cough pills one or 2 pills 3 times daily as needed for daytime cough. This will not make you sleepy.  Take the antibiotic, amoxicillin 875 mg 1 pill twice daily for the full 7 day course.  Return if problems   IF you received an x-ray today, you will receive an invoice from [facility] Radiology. Please contact [facility] Radiology at [phone] with questions or concerns regarding your invoice.   IF you received labwork today, you will receive an invoice from [employer]. Please contact [facility] at [phone] with questions or concerns regarding your invoice.   Our billing staff will not be able to assist you with questions regarding bills from these companies.  You will be contacted with the lab results as soon as they are available. The fastest way to get your results is to activate your My Chart account. Instructions are located on the last page of this paperwork. If you have not heard from [place] regarding the results in 2 weeks, please contact this office.

## 2016-10-02 NOTE — Progress Notes (Signed)
   Subjective:    Patient ID: Rebecca Moss, female    DOB: 1971/06/10, 45 y.o.   MRN: [id number]  HPI patient is here complaining of a cough for the last 5 days. She brings up mostly clear, sometimes yellow phlegm. She does not smoke. She has possibly had some mild fevers, nothing major. Has head congestion and postnasal drainage. No ear pain. She has some chest wall pains from coughing.    Review of Systems   no major acute complaints except some headaches. No GI problems.  Objective:   Physical Exam  healthy-appearing lady, coughing, wrapped up heavily today. Her TMs are normal. Throat not erythematous. Neck supple without significant nodes. No tenderness of the sinuses. Chest clear. Heart regular without murmurs.        Assessment & Plan:   URI and bronchitis  Plan to treat symptomatically. Hycodan, Tessalon, amoxicillin   Drink plenty of fluids and get enough rest  Take the cough syrup, Hycodan, primarily at nighttime or when you are not working. 1 teaspoon every 4-6 hours. It does cause used to be sleepy.  Take the benzonatate cough pills one or 2 pills 3 times daily as needed for daytime cough. This will not make you sleepy.  Take the antibiotic, amoxicillin 875 mg 1 pill twice daily for the full 7 day course.  Return if problems

## 2016-11-30 IMAGING — CT CT HEAD W/O CM
4 series · 18 of 47 positions shown, 20 images · non-contrast
Comparison: None.

CLINICAL DATA: Concussion. Fall off ladder with head injury this
morning. Soft tissue swelling left parietal region. Brief loss of
consciousness. Initial encounter.

EXAM:
CT HEAD WITHOUT CONTRAST
TECHNIQUE: Contiguous axial images were obtained from the base of the skull
through the vertex without intravenous contrast.

[Series 201: head w/o, idose (1) · axial · non-contrast · 0.49mm/px · z∈[+119,+239]mm · 8 of 32 slices shown, 10 images]
[im 4/32  brain]
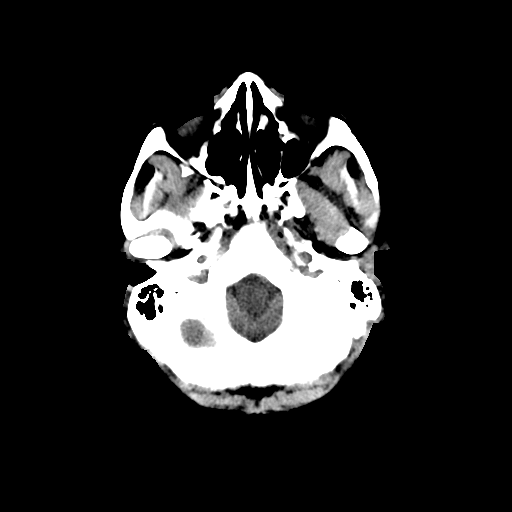
[im 4/32  bone]
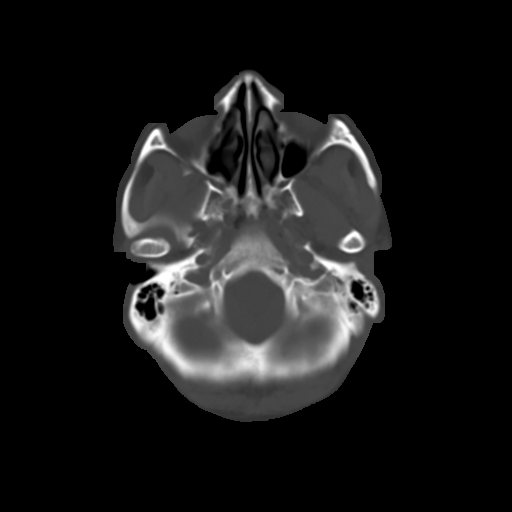
[im 7/32  brain]
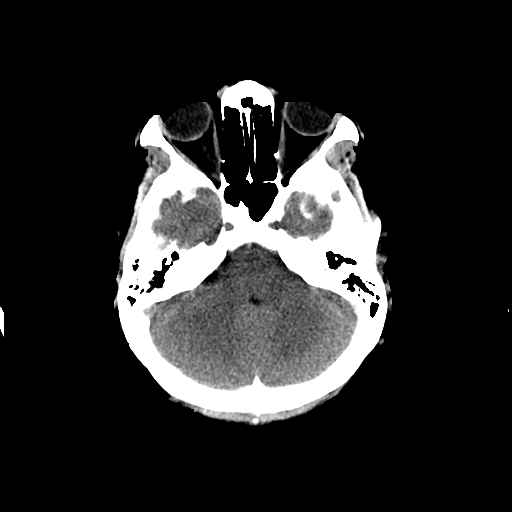
[im 11/32  brain]
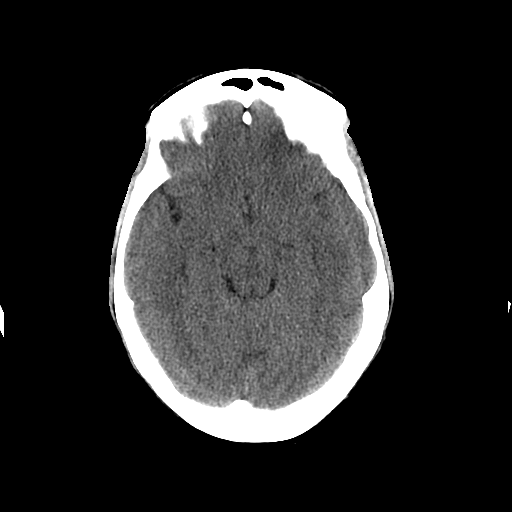
[im 14/32  brain]
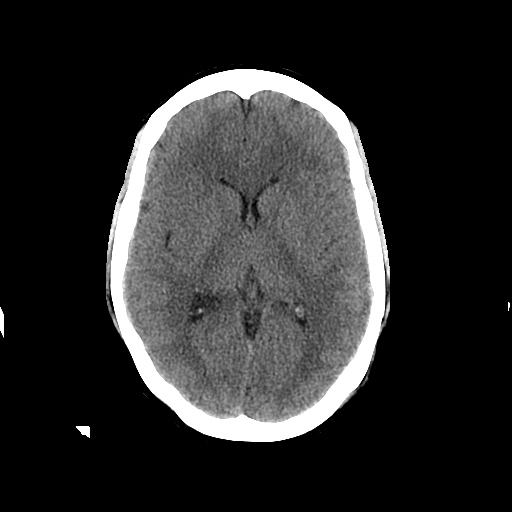
[im 18/32  brain]
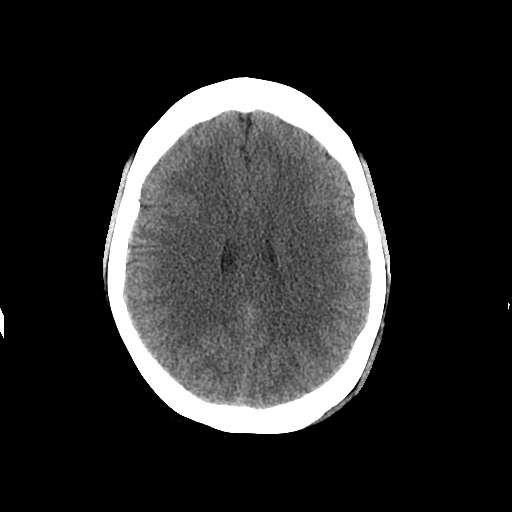
[im 18/32  bone]
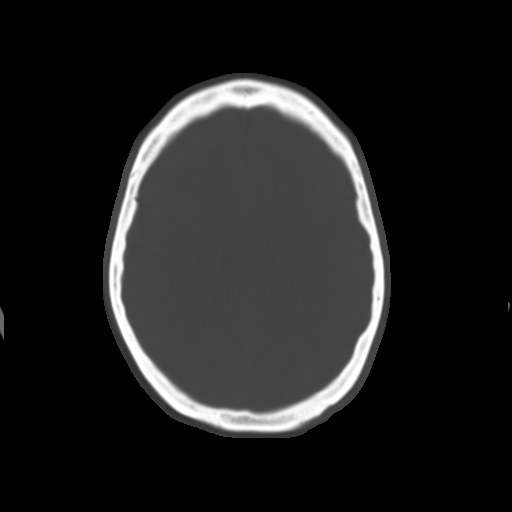
[im 21/32  brain]
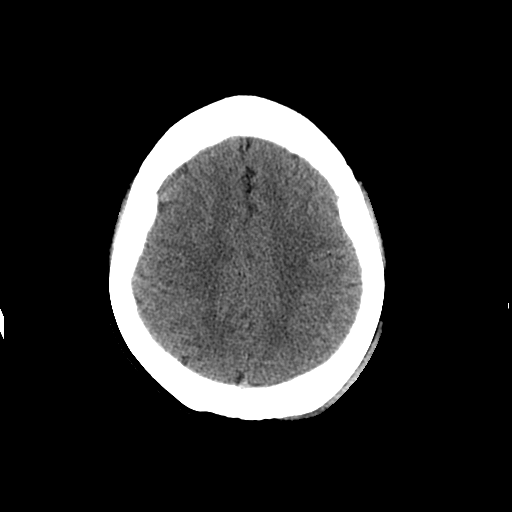
[im 25/32  brain]
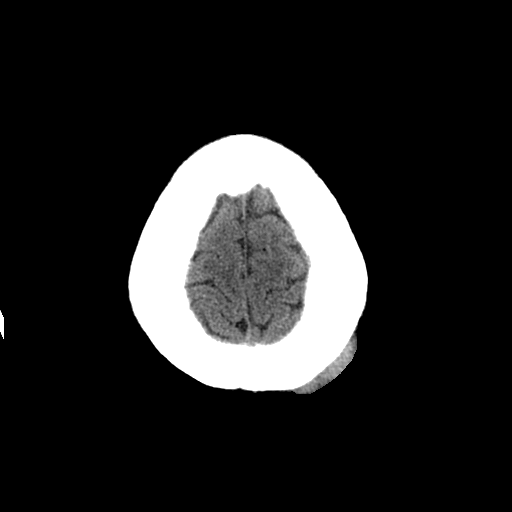
[im 28/32  brain]
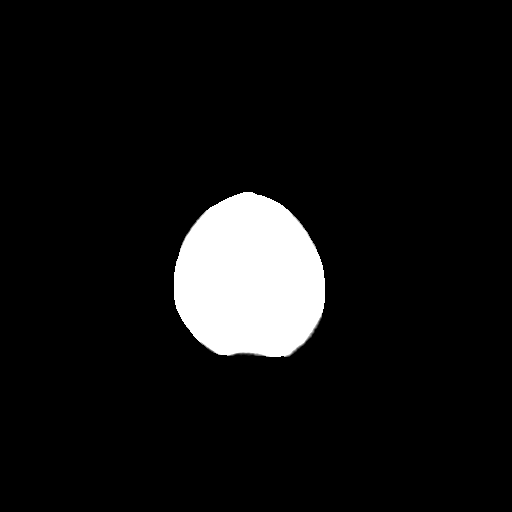

[Series 202: head w/o bone, idose (1) · axial · non-contrast · 0.49mm/px · z∈[+118,+168]mm · 4 of 64 slices shown]
[im 7/64  bone]
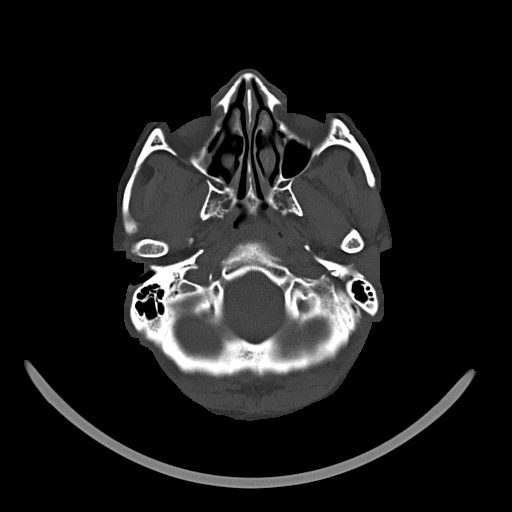
[im 14/64  bone]
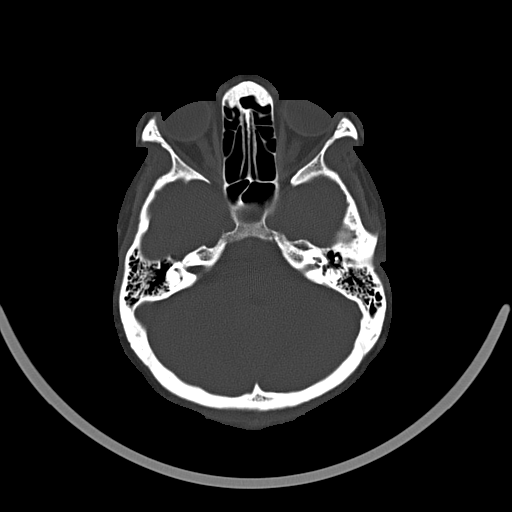
[im 20/64  bone]
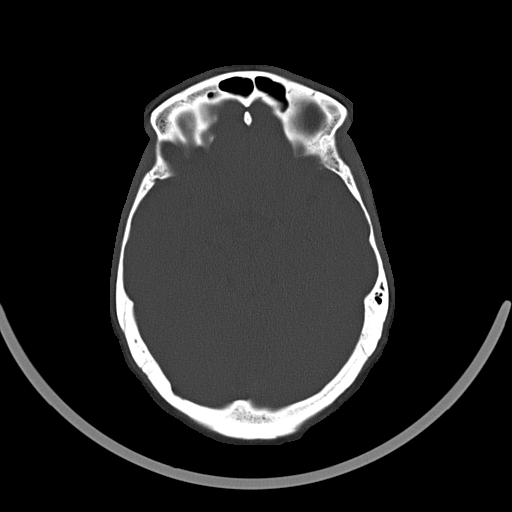
[im 27/64  bone]
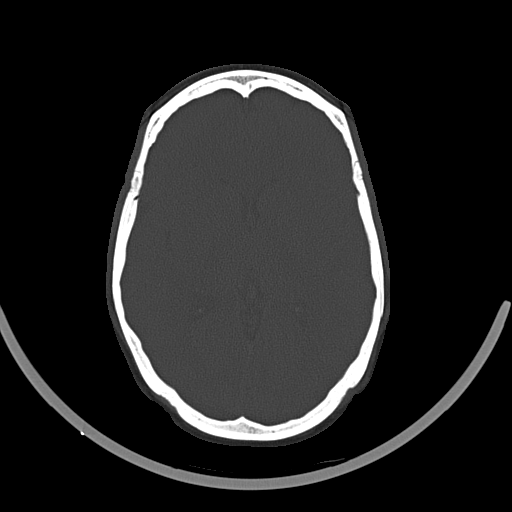

[Series 203: coronal st, idose (1) · coronal · 0.40mm/px · 3 of 74 slices shown]
[im 25/74  brain]
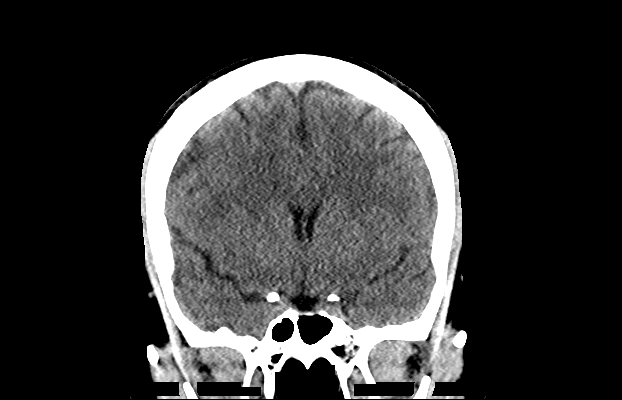
[im 33/74  brain]
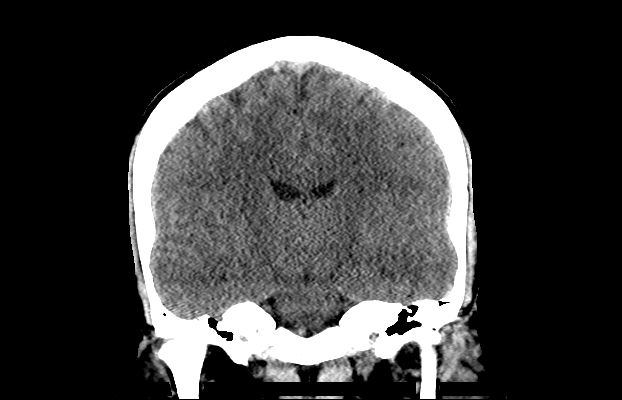
[im 41/74  brain]
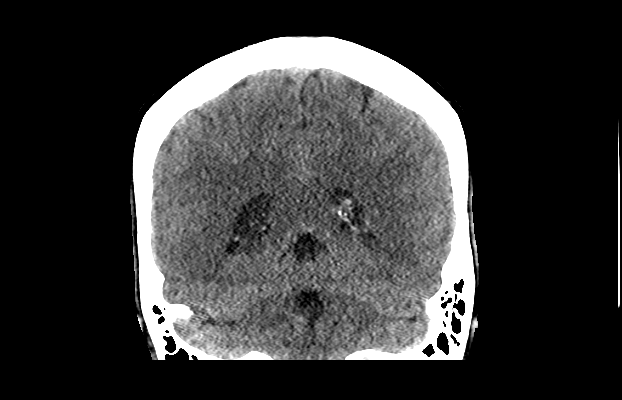

[Series 204: sagittal st, idose (1) · sagittal · 0.40mm/px · 3 of 82 slices shown]
[im 28/82  brain]
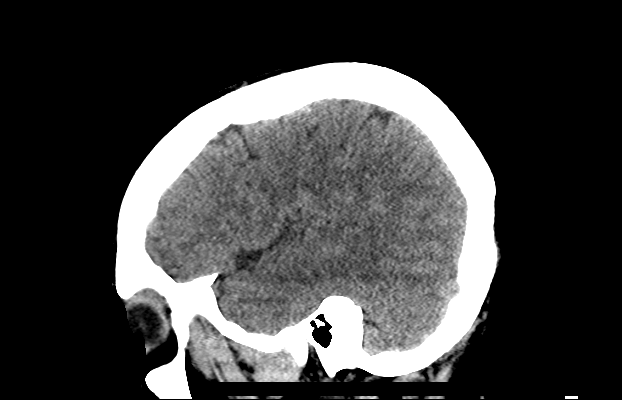
[im 41/82  brain]
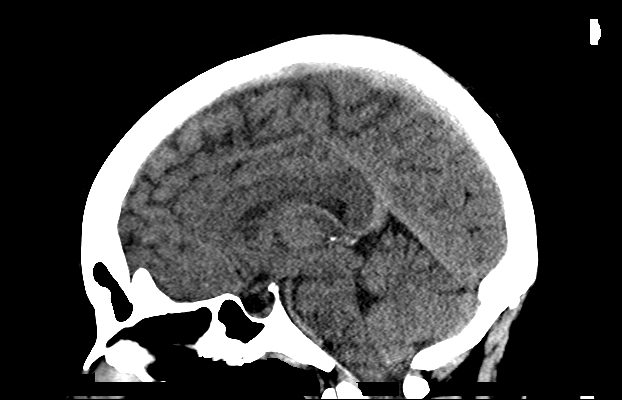
[im 55/82  brain]
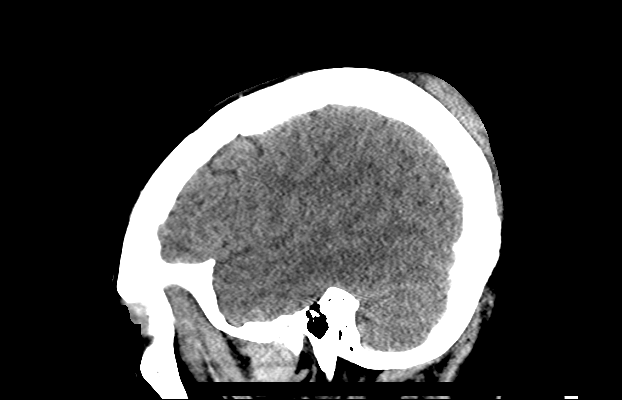

[18 of 47 positions shown; findings below may reference images not displayed]

FINDINGS: There is no evidence of acute cortical infarct, intracranial
hemorrhage, mass, midline shift, or extra-axial fluid collection.
Ventricles and sulci are normal.

There is a moderate size left parietal scalp hematoma. Orbits are
unremarkable. Visualized paranasal sinuses and mastoid air cells are
clear. No skull fracture is identified.
IMPRESSION: 1. No evidence of acute intracranial abnormality.
2. Left parietal scalp hematoma.

## 2017-07-29 ENCOUNTER — Encounter: Payer: Self-pay | Admitting: Family Medicine

## 2017-07-29 ENCOUNTER — Ambulatory Visit (INDEPENDENT_AMBULATORY_CARE_PROVIDER_SITE_OTHER): Payer: Self-pay | Admitting: Family Medicine

## 2017-07-29 VITALS — BP 117/70 | HR 60 | Temp 98.2°F | Resp 16 | Ht 63.0 in | Wt 175.0 lb

## 2017-07-29 DIAGNOSIS — Z23 Encounter for immunization: Secondary | ICD-10-CM

## 2017-07-29 DIAGNOSIS — Z01419 Encounter for gynecological examination (general) (routine) without abnormal findings: Secondary | ICD-10-CM

## 2017-07-29 LAB — POCT URINALYSIS DIP (MANUAL ENTRY)
Bilirubin, UA: NEGATIVE
Glucose, UA: NEGATIVE mg/dL
Ketones, POC UA: NEGATIVE mg/dL
Leukocytes, UA: NEGATIVE
Nitrite, UA: NEGATIVE
Protein Ur, POC: NEGATIVE mg/dL
Spec Grav, UA: 1.02 (ref 1.010–1.025)
Urobilinogen, UA: 0.2 E.U./dL
pH, UA: 5 (ref 5.0–8.0)

## 2017-07-29 LAB — POCT WET + KOH PREP
Trich by wet prep: ABSENT
Yeast by KOH: ABSENT

## 2017-07-29 MED ORDER — FLUCONAZOLE 150 MG PO TABS: 150.0000 mg | ORAL_TABLET | Freq: Once | ORAL | 0 refills | Status: AC

## 2017-07-29 MED ORDER — METRONIDAZOLE 500 MG PO TABS: 500.0000 mg | ORAL_TABLET | Freq: Two times a day (BID) | ORAL | 0 refills | Status: DC

## 2017-07-29 NOTE — Progress Notes (Signed)
9/21/20182:48 PM  Rebecca Moss 10-06-1971, 46 y.o. female [id number]  Chief Complaint  Patient presents with  . Annual Exam    HPI:   Patient is a 46 y.o. female with no significant past medical history who presents today for annual exam.   Patient is a G0 Last pap maybe about 3 years ago, in [place] Denies h/o abnormal pap Reports regular monthly menses Denies any breast concerns Reports mild vaginal irritation with scant discharge Denies fhx breast, ovarian or colon cancer Her mother died of gastric cancer She denies any acute concerns today  Depression screen [facility] 2/9 07/29/2017 10/02/2016  Decreased Interest 0 0  Down, Depressed, Hopeless 0 0  PHQ - 2 Score 0 0    No Known Allergies  No current outpatient prescriptions on file prior to visit.   No current facility-administered medications on file prior to visit.     Past Medical History:  Diagnosis Date  . Uterine fibroid     Past Surgical History:  Procedure Laterality Date  . UTERINE FIBROID SURGERY      Social History  Substance Use Topics  . Smoking status: Never Smoker  . Smokeless tobacco: Never Used  . Alcohol use No    History reviewed. No pertinent family history.  Review of Systems  Constitutional: Negative for chills, fever, malaise/fatigue and weight loss.  HENT: Positive for congestion. Negative for ear pain, sinus pain and sore throat.   Eyes: Negative for blurred vision.  Respiratory: Negative for cough and shortness of breath.   Cardiovascular: Negative for chest pain, palpitations and leg swelling.  Gastrointestinal: Negative for abdominal pain, blood in stool, constipation, diarrhea, melena, nausea and vomiting.  Genitourinary: Negative for dysuria, frequency, hematuria and urgency.  Musculoskeletal: Positive for back pain (R thoracic area).  Neurological: Positive for tingling (BUE, mostly when she wakes up). Negative for sensory change, speech change and focal weakness.    Psychiatric/Behavioral: Negative for depression. The patient is not nervous/anxious.      OBJECTIVE:  Blood pressure 117/70, pulse 60, temperature 98.2 F (36.8 C), temperature source Oral, resp. rate 16, height 5\' 3"  (1.6 m), weight 175 lb (79.4 kg), last menstrual period 07/07/2017, SpO2 98 %.  Physical Exam  Constitutional: She is oriented to person, place, and time and well-developed, well-nourished, and in no distress.  HENT:  Head: Normocephalic and atraumatic.  Right Ear: Hearing, tympanic membrane, external ear and ear canal normal.  Left Ear: Hearing, tympanic membrane, external ear and ear canal normal.  Nose: Mucosal edema present.  Mouth/Throat: Oropharynx is clear and moist.  Eyes: Pupils are equal, round, and reactive to light. EOM are normal.  Neck: Neck supple. No thyromegaly present.  Cardiovascular: Normal rate, regular rhythm and normal heart sounds.  Exam reveals no gallop and no friction rub.   No murmur heard. Pulmonary/Chest: Effort normal and breath sounds normal. She has no wheezes. She has no rales.  Abdominal: Soft. Bowel sounds are normal. She exhibits no distension and no mass. There is no tenderness.  Genitourinary: Cervix normal, right adnexa normal, left adnexa normal and vulva normal. Uterus is enlarged. Uterus is not fixed and not tender. Thin  odorless  white and vaginal discharge found.  Musculoskeletal: Normal range of motion. She exhibits no edema.       Thoracic back: She exhibits tenderness and spasm.  Lymphadenopathy:    She has no cervical adenopathy.  Neurological: She is alert and oriented to person, place, and time. Gait normal.  Skin: Skin  is warm and dry.  Psychiatric: Mood and affect normal.    Results for orders placed or performed in visit on 07/29/17  POCT Wet + KOH Prep  Result Value Ref Range   Yeast by KOH Absent Absent   Yeast by wet prep Present (A) Absent   WBC by wet prep Moderate (A) Few   Clue Cells Wet Prep HPF POC  Many (A) None   Trich by wet prep Absent Absent   Bacteria Wet Prep HPF POC Moderate (A) Few   Epithelial Cells By [employer] ([facility]) None None, Few, Too numerous to count   RBC,UR,HPF,POC None None RBC/hpf  POCT urinalysis dipstick  Result Value Ref Range   Color, UA yellow yellow   Clarity, UA clear clear   Glucose, UA negative negative mg/dL   Bilirubin, UA negative negative   Ketones, POC UA negative negative mg/dL   Spec Grav, UA 1.020 1.010 - 1.025   Blood, UA small (A) negative   pH, UA 5.0 5.0 - 8.0   Protein Ur, POC negative negative mg/dL   Urobilinogen, UA 0.2 0.2 or 1.0 E.U./dL   Nitrite, UA Negative Negative   Leukocytes, UA Negative Negative     ASSESSMENT and PLAN:  1. Encounter for annual routine gynecological examination Overall doing well. Discussed HCM, labs per below. Treating for yeast and BV per wet prep. Discussed anticipatory guidance re diet and exercise.   - Pap IG w/ reflex to HPV when ASC-U ([facility]) - POCT Wet + KOH Prep - CBC with Differential - Comprehensive metabolic panel - TSH - Lipid panel - POCT urinalysis dipstick  2. Need for influenza vaccination Flu vaccine given today. - Flu Vaccine QUAD 36+ mos IM  Meds ordered this encounter  Medications  . fluconazole (DIFLUCAN) 150 MG tablet    Sig: Take 1 tablet (150 mg total) by mouth once.    Dispense:  1 tablet    Refill:  0  . metroNIDAZOLE (FLAGYL) 500 MG tablet    Sig: Take 1 tablet (500 mg total) by mouth 2 (two) times daily.    Dispense:  14 tablet    Refill:  [place], MD [facility] [place], [place] [address] Ph.  [phone] Fax [phone]

## 2017-07-29 NOTE — Patient Instructions (Signed)
     IF you received an x-ray today, you will receive an invoice from [facility]. Please contact [facility] at [phone] with questions or concerns regarding your invoice.   IF you received labwork today, you will receive an invoice from [facility]. Please contact [facility] at [phone] with questions or concerns regarding your invoice.   Our billing staff will not be able to assist you with questions regarding bills from these companies.  You will be contacted with the lab results as soon as they are available. The fastest way to get your results is to activate your My Chart account. Instructions are located on the last page of this paperwork. If you have not heard from us regarding the results in 2 weeks, please contact this office.     

## 2017-07-30 LAB — COMPREHENSIVE METABOLIC PANEL
ALT: 12 IU/L (ref 0–32)
AST: 17 IU/L (ref 0–40)
Albumin/Globulin Ratio: 1.8 (ref 1.2–2.2)
Albumin: 4.5 g/dL (ref 3.5–5.5)
Alkaline Phosphatase: 61 IU/L (ref 39–117)
BUN/Creatinine Ratio: 17 (ref 9–23)
BUN: 11 mg/dL (ref 6–24)
Bilirubin Total: 0.4 mg/dL (ref 0.0–1.2)
CO2: 20 mmol/L (ref 20–29)
Calcium: 9.1 mg/dL (ref 8.7–10.2)
Chloride: 106 mmol/L (ref 96–106)
Creatinine, Ser: 0.63 mg/dL (ref 0.57–1.00)
GFR calc Af Amer: 125 mL/min/{1.73_m2} (ref >59)
GFR calc non Af Amer: 109 mL/min/{1.73_m2} (ref >59)
Globulin, Total: 2.5 g/dL (ref 1.5–4.5)
Glucose: 84 mg/dL (ref 65–99)
Potassium: 4.4 mmol/L (ref 3.5–5.2)
Sodium: 141 mmol/L (ref 134–144)
Total Protein: 7 g/dL (ref 6.0–8.5)

## 2017-07-30 LAB — CBC WITH DIFFERENTIAL/PLATELET
Basophils Absolute: 0 10*3/uL (ref 0.0–0.2)
Basos: 0 %
EOS (ABSOLUTE): 0 10*3/uL (ref 0.0–0.4)
Eos: 1 %
Hematocrit: 40.6 % (ref 34.0–46.6)
Hemoglobin: 13.9 g/dL (ref 11.1–15.9)
Immature Grans (Abs): 0 10*3/uL (ref 0.0–0.1)
Immature Granulocytes: 0 %
Lymphocytes Absolute: 2.1 10*3/uL (ref 0.7–3.1)
Lymphs: 34 %
MCH: 30.2 pg (ref 26.6–33.0)
MCHC: 34.2 g/dL (ref 31.5–35.7)
MCV: 88 fL (ref 79–97)
Monocytes Absolute: 0.4 10*3/uL (ref 0.1–0.9)
Monocytes: 6 %
Neutrophils Absolute: 3.7 10*3/uL (ref 1.4–7.0)
Neutrophils: 59 %
Platelets: 307 10*3/uL (ref 150–379)
RBC: 4.61 x10E6/uL (ref 3.77–5.28)
RDW: 13.7 % (ref 12.3–15.4)
WBC: 6.1 10*3/uL (ref 3.4–10.8)

## 2017-07-30 LAB — LIPID PANEL
Chol/HDL Ratio: 4.1 ratio (ref 0.0–4.4)
Cholesterol, Total: 193 mg/dL (ref 100–199)
HDL: 47 mg/dL (ref >39)
LDL Calculated: 133 mg/dL — ABNORMAL HIGH (ref 0–99)
Triglycerides: 66 mg/dL (ref 0–149)
VLDL Cholesterol Cal: 13 mg/dL (ref 5–40)

## 2017-07-30 LAB — TSH: TSH: 2.98 u[IU]/mL (ref 0.450–4.500)

## 2017-08-01 LAB — PAP IG W/ RFLX HPV ASCU: PAP Smear Comment: 0

## 2017-10-22 HISTORY — PX: ABDOMINAL HYSTERECTOMY: SHX81

## 2018-03-28 ENCOUNTER — Ambulatory Visit: Payer: Self-pay | Admitting: Urgent Care

## 2018-03-28 ENCOUNTER — Other Ambulatory Visit: Payer: Self-pay

## 2018-03-28 ENCOUNTER — Encounter: Payer: Self-pay | Admitting: Urgent Care

## 2018-03-28 VITALS — BP 124/64 | HR 76 | Temp 98.5°F | Resp 16 | Ht 63.0 in | Wt 179.6 lb

## 2018-03-28 DIAGNOSIS — R1013 Epigastric pain: Secondary | ICD-10-CM

## 2018-03-28 DIAGNOSIS — Z9109 Other allergy status, other than to drugs and biological substances: Secondary | ICD-10-CM

## 2018-03-28 DIAGNOSIS — R195 Other fecal abnormalities: Secondary | ICD-10-CM

## 2018-03-28 DIAGNOSIS — R109 Unspecified abdominal pain: Secondary | ICD-10-CM

## 2018-03-28 DIAGNOSIS — R112 Nausea with vomiting, unspecified: Secondary | ICD-10-CM

## 2018-03-28 MED ORDER — OMEPRAZOLE 20 MG PO CPDR: 20.0000 mg | DELAYED_RELEASE_CAPSULE | Freq: Every day | ORAL | 3 refills | Status: DC

## 2018-03-28 MED ORDER — CETIRIZINE HCL 10 MG PO TABS: 10.0000 mg | ORAL_TABLET | Freq: Every day | ORAL | 1 refills | Status: DC

## 2018-03-28 NOTE — Progress Notes (Signed)
    MRN: [id number] DOB: August 21, 1971  Subjective:   Rebecca Moss is a 47 y.o. female presenting for 4 day history of intermittent epigastric/upper abdominal pain, now having pain to her left flank and back. Had 1 episode of vomiting and loose stool. Has had decreased appetite without nausea. Patient is taking hormone therapy since she had an abdominal hysterectomy. Denies history of gall bladder disease. Denies alcohol use.   Rebecca Moss is not currently taking any medications. Also has No Known Allergies.  Rebecca Moss  has a past medical history of Uterine fibroid. Also  has a past surgical history that includes Uterine fibroid surgery and Abdominal hysterectomy (10/22/2017).  Objective:   Vitals: BP 124/64 (BP Location: Left Arm, Patient Position: Sitting, Cuff Size: Normal)   Pulse 76   Temp 98.5 F (36.9 C) (Oral)   Resp 16   Ht 5\' 3"  (1.6 m)   Wt 179 lb 9.6 oz (81.5 kg)   LMP 07/07/2017   SpO2 97%   BMI 31.81 kg/m   Physical Exam  Constitutional: She is oriented to person, place, and time. She appears well-developed and well-nourished.  Cardiovascular: Normal rate, regular rhythm and intact distal pulses. Exam reveals no gallop and no friction rub.  No murmur heard. Pulmonary/Chest: No respiratory distress. She has no wheezes. She has no rales.  Abdominal: Soft. Bowel sounds are normal. She exhibits no distension and no mass. There is tenderness in the epigastric area and left upper quadrant.  Musculoskeletal: She exhibits no edema.  Neurological: She is alert and oriented to person, place, and time.  Skin: Skin is warm and dry. No rash noted. No erythema. No pallor.   Assessment and Plan :   Abdominal pain, epigastric - Plan: H. pylori breath test, Comprehensive metabolic panel  Nausea and vomiting, intractability of vomiting not specified, unspecified vomiting type  Loose stools  Left flank pain  Environmental allergies  We will have patient start Prilosec to cover for acid  reflux.  Labs pending.  Counseled on differential which includes H. pylori.  We will follow-up with lab results.  Jaynee Eagles, PA-C [facility] [phone] 03/28/2018  3:32 PM

## 2018-03-28 NOTE — Patient Instructions (Addendum)
Dolor abdominal en adultos Abdominal Pain, Adult El dolor abdominal puede tener muchas causas. A menudo, no es grave y [place] sin tratamiento o con tratamiento en la casa. Sin embargo, a Product/process development scientist abdominal es intenso. El mdico revisar sus antecedentes mdicos y le har un examen fsico para tratar de Office manager causa del dolor abdominal. Siga estas instrucciones en su casa:  Tome los medicamentos de venta libre y los recetados solamente como se lo haya indicado el mdico. No tome un laxante a menos que se lo haya indicado el mdico.  Beba suficiente lquido para Theatre manager la orina clara o de color amarillo plido.  Controle su afeccin para ver si hay cambios.  Concurra a todas las visitas de control como se lo haya indicado el mdico. Esto es importante. Comunquese con un mdico si:  El dolor abdominal cambia o empeora.  No tiene apetito o baja de peso sin proponrselo.  Est estreido o tiene diarrea durante ms de 2 o 3das.  Tiene dolor cuando orina o defeca.  El dolor abdominal lo despierta de noche.  El dolor empeora con las comidas, despus de comer o con determinados alimentos.  Tiene vmitos y no puede retener nada.  Tiene fiebre. Solicite ayuda de inmediato si:  El dolor no desaparece tan pronto como el mdico le dijo que era esperable.  No puede detener los vmitos.  El Social research officer, government se siente solo en zonas del abdomen, como el lado derecho o la parte inferior izquierda del abdomen.  Las heces son sanguinolentas o de color negro, o de aspecto alquitranado.  Tiene dolor intenso, clicos, o meteorismo en el abdomen.  Tiene signos de deshidratacin, por ejemplo: ? Elmon Else, muy escasa o falta de [place]. ? Labios agrietados. ? [employer]. ? Ojos hundidos. ? Somnolencia. ? Debilidad. Esta informacin no tiene Marine scientist el consejo del mdico. Asegrese de hacerle al mdico cualquier pregunta que tenga. Document Released: 10/25/2005 Document  Revised: 10/14/2016 Document Reviewed: 04/07/2016 Elsevier Interactive Patient Education  2018 [place] de alimentos para pacientes adultos con enfermedad de reflujo gastroesofgico Food Choices for Gastroesophageal Reflux Disease, Adult Si tiene enfermedad de reflujo gastroesofgico (ERGE), los alimentos que consume y los hbitos de alimentacin son Theatre stage manager. Elegir los alimentos adecuados puede ayudar a Public house manager las molestias ocasionadas por la [employer]. Considere la posibilidad de trabajar con un especialista en dieta y nutricin (nutricionista) que lo ayude a Adult nurse saludables. Qu pautas generales debo seguir? Plan de alimentacin Elija alimentos saludables con bajo contenido de grasa, como frutas, verduras, cereales integrales, productos lcteos descremados, carne magra de vaca, de pescado y de ave. Haga comidas pequeas con frecuencia en lugar de tres comidas abundantes al da. Coma lentamente, en un ambiente distendido. Evite agacharse o recostarse hasta despus de 2 o 3horas de haber comido. Limite los alimentos con alto contenido graso como las carnes grasas o los alimentos fritos. Limite el consumo de [employer], [place] y [place] a menos de 8 cucharaditas al Training and development officer. Evite lo siguiente: Consumir alimentos que le ocasionen sntomas. Pueden ser distintos para cada persona. Lleve un registro de los alimentos para identificar aquellos que le ocasionen sntomas. Consumir alcohol. Beber grandes cantidades de lquido con las comidas. Comer 2 o 3 horas antes de acostarse. Cocine los alimentos utilizando mtodos que no sean la fritura. Esto puede Ship broker, Marine scientist y hervir. Estilo de vida  Mantenga un peso saludable. Pregunte a su mdico cul es el peso saludable para usted.  Si debe perder peso, hable con su mdico para hacerlo de manera segura. Realice actividad fsica durante, al menos, 30 minutos 5 das por semana o ms, o segn lo indicado por su  mdico. Evite usar ropa ajustada alrededor de la cintura y Management consultant. No consuma ningn producto que contenga nicotina o tabaco, como cigarrillos y Psychologist, sport and exercise. Si necesita ayuda para dejar de fumar, consulte al mdico. Duerma con la cabecera de la cama elevada. Use una cua debajo del colchn o bloques debajo del armazn de la cama para Theatre manager la cabecera de la cama elevada. Qu alimentos no se recomiendan? Esta podra no ser [employer]. Hable con el nutricionista sobre las mejores opciones alimenticias para usted. Carbohidratos Pasteles o panes sin levadura con grasa agregada. Tostadas francesas. Verduras Verduras fritas en abundante aceite. Papas fritas. Cualquier verdura que est preparada con grasa agregada. Cualquier verdura que le ocasione sntomas. Para algunas personas, estas pueden incluir tomates y productos con tomate, [place], cebollas y [place], y rbanos picantes. Frutas Cualquier fruta que est preparada con grasa agregada. Cualquier fruta que le ocasione sntomas. Para algunas personas, estas pueden incluir, las frutas ctricas como naranja, pomelo, pia y limn. Carnes y otros alimentos ricos en protenas Carnes de alto contenido graso como carne grasa de vaca o cerdo, salchichas, costillas, [place], salchicha, salame y tocino. Carnes o protenas fritas, lo que incluye pescado frito y pollo frito. Nueces y [place] de frutos secos. Lcteos Leche entera y [place] con chocolate. [employer]. Crema. Helados. Queso crema. Batidos con [employer]. Bebidas Caf y t negro, con o sin cafena. Bebidas carbonatadas. Refrescos. Bebidas energizantes. Jugo de fruta hecho con frutas cidas (como naranja o pomelo). Jugo de tomate. Bebidas alcohlicas. Grasas y [employer]. Margarina. Lardo. Mantequilla clarificada. Dulces y postres Chocolate y cacao. Rosquillas. Condimentos y otros alimentos [place]. Menta y mentol. Cualquier condimento, hierbas o aderezos que le  ocasionen sntomas. Para algunas personas, esto puede incluir curry, salsa picante o aderezos para ensalada a base de vinagre. Resumen Si tiene enfermedad de reflujo gastroesofgico (ERGE), las elecciones de alimentos y [place] de vida son muy importantes para ayudar a Public house manager las molestias de la [place]. Haga comidas pequeas con frecuencia en lugar de tres comidas abundantes al da. Coma lentamente, en un ambiente distendido. Evite agacharse o recostarse hasta despus de 2 o 3horas de haber comido. Limite los alimentos con alto contenido graso como la carne grasa o los alimentos fritos. Esta informacin no tiene Marine scientist el consejo del mdico. Asegrese de hacerle al mdico cualquier pregunta que tenga. Document Released: 08/04/2005 Document Revised: 02/14/2017 Document Reviewed: 08/29/2013 Elsevier Interactive Patient Education  2018 [employer].     IF you received an x-ray today, you will receive an invoice from [facility]. Please contact [facility] at [phone] with questions or concerns regarding your invoice.   IF you received labwork today, you will receive an invoice from [facility]. Please contact [facility] at [phone] with questions or concerns regarding your invoice.   Our billing staff will not be able to assist you with questions regarding bills from these companies.  You will be contacted with the lab results as soon as they are available. The fastest way to get your results is to activate your My Chart account. Instructions are located on the last page of this paperwork. If you have not heard from [place] regarding the results in 2 weeks, please contact this office.

## 2018-03-29 LAB — COMPREHENSIVE METABOLIC PANEL
A/G RATIO: 1.7 (ref 1.2–2.2)
ALBUMIN: 4.4 g/dL (ref 3.5–5.5)
ALT: 28 IU/L (ref 0–32)
AST: 24 IU/L (ref 0–40)
Alkaline Phosphatase: 86 IU/L (ref 39–117)
BILIRUBIN TOTAL: 0.2 mg/dL (ref 0.0–1.2)
BUN / CREAT RATIO: 17 (ref 9–23)
BUN: 12 mg/dL (ref 6–24)
CO2: 22 mmol/L (ref 20–29)
CREATININE: 0.69 mg/dL (ref 0.57–1.00)
Calcium: 8.9 mg/dL (ref 8.7–10.2)
Chloride: 102 mmol/L (ref 96–106)
GFR calc Af Amer: 122 mL/min/{1.73_m2} (ref >59)
GFR calc non Af Amer: 105 mL/min/{1.73_m2} (ref >59)
GLOBULIN, TOTAL: 2.6 g/dL (ref 1.5–4.5)
Glucose: 90 mg/dL (ref 65–99)
POTASSIUM: 4 mmol/L (ref 3.5–5.2)
SODIUM: 138 mmol/L (ref 134–144)
Total Protein: 7 g/dL (ref 6.0–8.5)

## 2018-03-29 LAB — H. PYLORI BREATH TEST: H pylori Breath Test: NEGATIVE

## 2018-03-29 LAB — H. PYLORI BREATH COLLECTION

## 2018-03-31 ENCOUNTER — Other Ambulatory Visit: Payer: Self-pay | Admitting: Urgent Care

## 2018-03-31 MED ORDER — RANITIDINE HCL 150 MG PO TABS: 150.0000 mg | ORAL_TABLET | Freq: Two times a day (BID) | ORAL | 0 refills | Status: DC

## 2018-09-19 ENCOUNTER — Encounter: Payer: Self-pay | Admitting: Family Medicine

## 2018-09-19 ENCOUNTER — Other Ambulatory Visit: Payer: Self-pay

## 2018-09-19 ENCOUNTER — Ambulatory Visit: Payer: Self-pay | Admitting: Family Medicine

## 2018-09-19 VITALS — BP 107/61 | HR 86 | Temp 99.0°F | Resp 16 | Ht 63.0 in | Wt 178.0 lb

## 2018-09-19 DIAGNOSIS — B9689 Other specified bacterial agents as the cause of diseases classified elsewhere: Secondary | ICD-10-CM

## 2018-09-19 DIAGNOSIS — J028 Acute pharyngitis due to other specified organisms: Secondary | ICD-10-CM

## 2018-09-19 DIAGNOSIS — J029 Acute pharyngitis, unspecified: Secondary | ICD-10-CM

## 2018-09-19 LAB — POCT RAPID STREP A (OFFICE): Rapid Strep A Screen: POSITIVE — AB

## 2018-09-19 MED ORDER — AMOXICILLIN-POT CLAVULANATE 875-125 MG PO TABS: 1.0000 | ORAL_TABLET | Freq: Two times a day (BID) | ORAL | 0 refills | Status: DC

## 2018-09-19 MED ORDER — FLUCONAZOLE 150 MG PO TABS: 150.0000 mg | ORAL_TABLET | Freq: Once | ORAL | 0 refills | Status: AC

## 2018-09-19 MED ORDER — FLUTICASONE PROPIONATE 50 MCG/ACT NA SUSP: 2.0000 | Freq: Every day | NASAL | 6 refills | Status: DC

## 2018-09-19 NOTE — Progress Notes (Signed)
Chief Complaint  Patient presents with  . flu like symptoms    x yesterday.     HPI   She works in cleaning Onset of symptoms 09/18/2018 Pt reports that she had sore throat, body aches, sweats, burping and headache in the frontal area No cough She reports that last night she felt very hot and felt a headache No sick contacts   Past Medical History:  Diagnosis Date  . Uterine fibroid     Current Outpatient Medications  Medication Sig Dispense Refill  . amoxicillin-clavulanate (AUGMENTIN) 875-125 MG tablet Take 1 tablet by mouth 2 (two) times daily. 20 tablet 0  . fluconazole (DIFLUCAN) 150 MG tablet Take 1 tablet (150 mg total) by mouth once for 1 dose. Repeat second dose in 3 days. For yeast infection. 2 tablet 0  . fluticasone (FLONASE) 50 MCG/ACT nasal spray Place 2 sprays into both nostrils daily. 16 g 6   No current facility-administered medications for this visit.     Allergies: No Known Allergies  Past Surgical History:  Procedure Laterality Date  . ABDOMINAL HYSTERECTOMY  10/22/2017  . UTERINE FIBROID SURGERY      Social History   Socioeconomic History  . Marital status: Married    Spouse name: Not on file  . Number of children: Not on file  . Years of education: Not on file  . Highest education level: Not on file  Occupational History  . Not on file  Social Needs  . Financial resource strain: Not on file  . Food insecurity:    Worry: Not on file    Inability: Not on file  . Transportation needs:    Medical: Not on file    Non-medical: Not on file  Tobacco Use  . Smoking status: Never Smoker  . Smokeless tobacco: Never Used  Substance and Sexual Activity  . Alcohol use: No  . Drug use: No  . Sexual activity: Not on file  Lifestyle  . Physical activity:    Days per week: Not on file    Minutes per session: Not on file  . Stress: Not on file  Relationships  . Social connections:    Talks on phone: Not on file    Gets together: Not on file     Attends religious service: Not on file    Active member of club or organization: Not on file    Attends meetings of clubs or organizations: Not on file    Relationship status: Not on file  Other Topics Concern  . Not on file  Social History Narrative   ** Merged History Encounter **        No family history on file.   ROS Review of Systems See HPI Constitution: see hpi No malaise No diaphoresis Skin: No rash or itching Eyes: no blurry vision, no double vision GU: no dysuria or hematuria Neuro: no dizziness or headaches all others reviewed and negative   Objective: Vitals:   09/19/18 1515  BP: 107/61  Pulse: 86  Resp: 16  Temp: 99 F (37.2 C)  TempSrc: Oral  SpO2: 98%  Weight: 178 lb (80.7 kg)  Height: 5\' 3"  (1.6 m)    Physical Exam General: alert, oriented, in NAD Head: normocephalic, atraumatic, no sinus tenderness Eyes: EOM intact, no scleral icterus or conjunctival injection Ears: TM clear but mucus  Noted behind the membrane Nose: mucosa nonerythematous, nonedematous Throat: +pharyngeal exudate, +erythema Lymph: no posterior auricular, submental or cervical lymph adenopathy Heart: normal rate, normal  sinus rhythm, no murmurs Lungs: clear to auscultation bilaterally, no wheezing  Rapid strep positive  Assessment and Plan Daphyne was seen today for flu like symptoms.  Diagnoses and all orders for this visit:  Bacterial pharyngitis- will treat with augmentin because of the sinus congestion as well as the positive strep  Acute pharyngitis, unspecified etiology -     POCT rapid strep A  Other orders -     amoxicillin-clavulanate (AUGMENTIN) 875-125 MG tablet; Take 1 tablet by mouth 2 (two) times daily. -     fluticasone (FLONASE) 50 MCG/ACT nasal spray; Place 2 sprays into both nostrils daily. -     fluconazole (DIFLUCAN) 150 MG tablet; Take 1 tablet (150 mg total) by mouth once for 1 dose. Repeat second dose in 3 days. For yeast  infection.     [place]

## 2018-09-19 NOTE — Patient Instructions (Addendum)
Take augmentin one tablet twice a day for 10 days for sore throat infection Also take flonase nasal spray for your congestion  Tome augmentin una [place] 10 das para el dolor de garganta Tambin tome aerosol nasal flonase para su congestin   After the antibiotic, if you get itching in the vagina (yeast infection) then take diflucan one tablet then take the second tablet in 3 days Despus del antibitico, si le pica la vagina (infeccin por hongos), tome una tableta de diflucan y luego la segunda tableta en 3 das.    If you have lab work done today you will be contacted with your lab results within the next 2 weeks.  If you have not heard from [place] then please contact us. The fastest way to get your results is to register for My Chart.   IF you received an x-ray today, you will receive an invoice from [facility] Radiology. Please contact [facility] Radiology at [phone] with questions or concerns regarding your invoice.   IF you received labwork today, you will receive an invoice from [place]. Please contact [facility] at [phone] with questions or concerns regarding your invoice.   Our billing staff will not be able to assist you with questions regarding bills from these companies.  You will be contacted with the lab results as soon as they are available. The fastest way to get your results is to activate your My Chart account. Instructions are located on the last page of this paperwork. If you have not heard from [place] regarding the results in 2 weeks, please contact this office.     Dolor de garganta (Sore Throat) El dolor de garganta es dolor, ardor, irritacin o sensacin de picazn en la garganta. Cuando usted tiene Social research officer, government de Investment banker, operational, puede sentir molestia o dolor en la garganta cuando traga o habla. Muchas cosas pueden causar dolor de garganta, entre ellas:  Una infeccin.  Alergias estacionales.  La sequedad en el aire.  Algunos irritantes,  como el humo o la polucin.  Enfermedad por reflujo gastroesofgico (ERGE).  Un tumor. Generalmente es Software engineer signo de otra enfermedad. Un dolor de garganta puede estar acompaado de otros sntomas, como tos, estornudos, fiebre y ganglios hinchados en el cuello. La mayor parte de los dolores de garganta desaparecen sin tratamiento [place] los medicamentos de venta libre solamente como se lo haya indicado el mdico.  Beba suficiente lquido para Theatre manager la orina clara o de color amarillo plido.  Descanse todo lo que sea necesario.  Para ayudar a Best boy, intente lo siguiente: ? Beba lquidos calientes, como caldos, infusiones o agua caliente. ? Tambin puede comer o beber lquidos fros o congelados, tales como paletas de hielo congelado. ? Haga grgaras con una mezcla de agua y sal 3 o 4veces al da, o cuando sea necesario. Para preparar la mezcla de agua y sal, disuelva totalmente de media a 1cucharadita de sal en 1taza de agua tibia. ? Chupar caramelos duros o pastillas para la garganta. ? Ponga un humidificador de vapor fro en la habitacin por la noche para humedecer el aire. ? Tambin puede abrir el agua caliente de la ducha y sentarse en el bao con la puerta cerrada durante 5a62minutos.  No consuma ningn producto que contenga tabaco, lo que incluye cigarrillos, tabaco de Higher education careers adviser y Psychologist, sport and exercise. Si necesita ayuda para dejar de fumar, consulte al mdico. SOLICITE ATENCIN MDICA SI:  Jaclynn Guarneri por  ms de 2 a 3 das.  Tiene fiebre o sntomas que duran (son persistentes) ms de 2 a 3 das.  El dolor de garganta no mejora en el trmino de 7 das.  Tiene fiebre y los sntomas empeoran repentinamente. SOLICITE ATENCIN MDICA DE INMEDIATO SI:  Tiene dificultad para respirar.  No puede tragar lquidos, alimentos blandos o la saliva.  Tiene ms inflamacin en la garganta o en el cuello.  Tiene  nuseas o vmitos persistentes. Esta informacin no tiene Marine scientist el consejo del mdico. Asegrese de hacerle al mdico cualquier pregunta que tenga. Document Released: 10/25/2005 Document Revised: 02/16/2016 Document Reviewed: 08/15/2015 Elsevier Interactive Patient Education  2018 [employer].

## 2019-06-06 ENCOUNTER — Other Ambulatory Visit: Payer: Self-pay

## 2019-06-06 DIAGNOSIS — Z20822 Contact with and (suspected) exposure to covid-19: Secondary | ICD-10-CM

## 2019-06-07 LAB — NOVEL CORONAVIRUS, NAA: SARS-CoV-2, NAA: DETECTED — AB

## 2019-06-09 ENCOUNTER — Telehealth: Payer: Self-pay | Admitting: Critical Care Medicine

## 2019-06-09 NOTE — Telephone Encounter (Signed)
I connected with Rebecca Moss by way of an interpreter.  She had been tested at our [facility] event on July 29.  She has had some symptoms for 3 days previous.  She is rapidly improving at this time.  She knows that she needs to stay in isolation until August 10.  I answered all of her questions.  I indicated the health department may be in touch with her.

## 2019-10-16 ENCOUNTER — Telehealth: Payer: Self-pay | Admitting: Family Medicine

## 2019-10-16 ENCOUNTER — Other Ambulatory Visit: Payer: Self-pay

## 2019-10-16 ENCOUNTER — Telehealth: Payer: Self-pay

## 2019-10-16 NOTE — Telephone Encounter (Signed)
Pt rescheduled for 10/17/2019. Pt is having polyuria, originally scheduled for telemed, urine needed. Pt verbalized understanding

## 2019-10-17 ENCOUNTER — Ambulatory Visit (INDEPENDENT_AMBULATORY_CARE_PROVIDER_SITE_OTHER): Payer: Self-pay | Admitting: Emergency Medicine

## 2019-10-17 ENCOUNTER — Encounter: Payer: Self-pay | Admitting: Emergency Medicine

## 2019-10-17 ENCOUNTER — Ambulatory Visit: Payer: Self-pay | Admitting: Emergency Medicine

## 2019-10-17 ENCOUNTER — Other Ambulatory Visit: Payer: Self-pay

## 2019-10-17 VITALS — BP 116/68 | HR 60 | Temp 98.1°F | Resp 16 | Ht 63.0 in | Wt 174.0 lb

## 2019-10-17 DIAGNOSIS — R350 Frequency of micturition: Secondary | ICD-10-CM

## 2019-10-17 DIAGNOSIS — N390 Urinary tract infection, site not specified: Secondary | ICD-10-CM

## 2019-10-17 LAB — POCT URINALYSIS DIP (MANUAL ENTRY)
Bilirubin, UA: NEGATIVE
Glucose, UA: NEGATIVE mg/dL
Ketones, POC UA: NEGATIVE mg/dL
Leukocytes, UA: NEGATIVE
Nitrite, UA: NEGATIVE
Protein Ur, POC: NEGATIVE mg/dL
Spec Grav, UA: >=1.03 — AB (ref 1.010–1.025)
Urobilinogen, UA: 0.2 E.U./dL
pH, UA: 5.5 (ref 5.0–8.0)

## 2019-10-17 LAB — POC MICROSCOPIC URINALYSIS (UMFC): Mucus: ABSENT

## 2019-10-17 LAB — POCT URINE PREGNANCY: Preg Test, Ur: NEGATIVE

## 2019-10-17 MED ORDER — SULFAMETHOXAZOLE-TRIMETHOPRIM 800-160 MG PO TABS: 1.0000 | ORAL_TABLET | Freq: Two times a day (BID) | ORAL | 0 refills | Status: AC

## 2019-10-17 MED ORDER — FLUTICASONE PROPIONATE 50 MCG/ACT NA SUSP: 2.0000 | Freq: Every day | NASAL | 6 refills | Status: DC

## 2019-10-17 NOTE — Patient Instructions (Addendum)
We recommend that you schedule a mammogram for breast cancer screening. Typically, you do not need a referral to do this. Please contact a local imaging center to schedule your mammogram.  [facility] - [phone]  *ask for the Radiology [place] ([place]) - [phone] or [phone]  [facility] - [phone] [place] [phone] [facility] - [phone]  *ask for the [facility] - [phone]  *ask for the Radiology Department [facility] - [phone]  *ask for the [place] - [phone]    If you have lab work done today you will be contacted with your lab results within the next 2 weeks.  If you have not heard from [place] then please contact us. The fastest way to get your results is to register for My Chart.   IF you received an x-ray today, you will receive an invoice from [facility] Radiology. Please contact [facility] Radiology at [phone] with questions or concerns regarding your invoice.   IF you received labwork today, you will receive an invoice from [place]. Please contact [facility] at [phone] with questions or concerns regarding your invoice.   Our billing staff will not be able to assist you with questions regarding bills from these companies.  You will be contacted with the lab results as soon as they are available. The fastest way to get your results is to activate your My Chart account. Instructions are located on the last page of this paperwork. If you have not heard from [place] regarding the results in 2 weeks, please contact this office.     Infeccin urinaria en los adultos Urinary Tract Infection, Adult Una infeccin urinaria (IU) puede ocurrir en Clinical cytogeneticist de las vas [place]. Las vas urinarias incluyen lo siguiente:  Los riones.  Los urteres.  La vejiga.  La  uretra. Estos rganos fabrican, almacenan y eliminan el pis (orina) del cuerpo. Cules son las causas? La causa es la presencia de grmenes (bacterias) en la zona genital. Estos grmenes proliferan y causan hinchazn (inflamacin) de las vas urinarias. Qu incrementa el riesgo? Es ms probable que contraiga esta afeccin si:  Tiene colocado un tubo delgado y pequeo (catter) para drenar el pis.  Nopuede controlar la evacuacin de pis ni de materia fecal (incontinencia).  Es Art therapist y, adems: ? [place] estos mtodos para Therapist, occupational: ? Un medicamento que [employer] espermatozoides (espermicida). ? Un dispositivo que impide el paso de los espermatozoides (diafragma). ? Tieneniveles bajos de una hormona femenina (estrgeno). ? Est embarazada.  Tiene genes que [employer].  Es sexualmente activa.  Toma antibiticos.  Tiene dificultad para orinar debido a: ? Su prstata es ms grande de lo normal, si usted es hombre. ? Obstruccin en la parte del cuerpo que drena el pis de la vejiga (uretra). ? Clculo renal. ? Untrastorno nervioso que afecta la vejiga (vejiga neurgena). ? No bebe una cantidad suficiente de lquido. ? No hace pis con la frecuencia suficiente.  Tiene otras afecciones, como: ? Diabetes. ? Un sistema que combate las enfermedades (sistema inmunitario) debilitado. ? Anemia drepanoctica. ? Gota. ? Lesin en la columna vertebral. Cules son los signos o los sntomas? Los sntomas de esta afeccin incluyen:  Necesidad inmediata (urgente) de hacer pis.  Hacer pis con frecuencia.  Hacer poca cantidad de pis con mucha frecuencia.  Dolor o ardor al [employer].  Sangre en el pis.  Pis que huele mal o anormal.  Dificultad para hacer pis.  Pis turbio.  Lquido que sale de la vagina, si es [place].  Dolor en la barriga o en la parte baja de la espalda. Otros sntomas pueden incluir los siguientes:  Vmitos.  No sentir deseos de  comer.  Sentirse confundido (confuso).  Sentirse cansado y malhumorado (irritable).  Cristy Hilts.  Materia fecal lquida (diarrea). Cmo se trata? El tratamiento de esta afeccin puede incluir:  Antibiticos.  Otros medicamentos.  Beber una cantidad suficiente de agua. Sigue estas instrucciones en tu casa:  Medicamentos  [employer] de venta libre y los recetados solamente como se lo haya indicado el mdico.  Si le recetaron un antibitico, tmelo como se lo haya indicado el mdico. No deje de tomarlo aunque comience a sentirse mejor. Indicaciones generales  Asegrese de hacer lo siguiente: ? Haga pis hasta que la vejiga quede vaca. ? Nocontenga el pis durante mucho tiempo. ? Vace la vejiga despus de Clinical biochemist. ? Lmpiese de adelante hacia atrs despus de defecar, si es mujer. Use cada trozo de papel higinico solo una vez cuando se limpie.  Beba suficiente lquido como para Theatre manager la orina de color amarillo plido.  Concurra a todas las visitas de seguimiento como se lo haya indicado el mdico. Esto es importante. Comunquese con un mdico si:  No mejora despus de 1 o 2das de tratamiento.  Los sntomas desaparecen y Teacher, adult education. Solicite ayuda inmediatamente si:  Tiene un dolor muy intenso en la espalda.  Tiene dolor muy intenso en la parte baja de la barriga.  Tener fiebre.  Tiene Higher education careers adviser (nuseas).  Tiene vmitos. Resumen  Una infeccin urinaria (IU) puede ocurrir en Clinical cytogeneticist de las vas [place].  Esta afeccin es causada por la presencia de grmenes en la zona genital.  Existen muchos factores de riesgo de sufrir una IU. Estos incluyen tener colocado un tubo delgado y pequeo para drenar el pis y no poder controlar cundo hace pis y materia fecal.  El tratamiento incluye antibiticos contra los grmenes.  Beba suficiente lquido como para Theatre manager la orina de color amarillo plido. Esta  informacin no tiene Marine scientist el consejo del mdico. Asegrese de hacerle al mdico cualquier pregunta que tenga. Document Released: 04/14/2010 Document Revised: 10/18/2018 Document Reviewed: 10/18/2018 Elsevier Patient Education  2020 [employer].

## 2019-10-17 NOTE — Progress Notes (Signed)
Rebecca Moss 48 y.o.   Chief Complaint  Patient presents with  . Urinary Frequency    pain on the LEFT side, with odor x 5 months, no burning    HISTORY OF PRESENT ILLNESS: This is a 48 y.o. female complaining of urinary frequency with some discomfort for the past several weeks on and off.  Denies abnormal vaginal discharge.  Status post total hysterectomy in the past. Denies fever or chills.  Denies abdominal pain or flank pain.  No other significant symptoms.  No other complaints or medical concerns today.  HPI   Prior to Admission medications   Medication Sig Start Date End Date Taking? Authorizing Provider  fluticasone (FLONASE) 50 MCG/ACT nasal spray Place 2 sprays into both nostrils daily. Patient not taking: Reported on 10/17/2019 09/19/18   Forrest Moron, MD    No Known Allergies  Patient Active Problem List   Diagnosis Date Noted  . Closed TBI (traumatic brain injury) ([place]) 01/03/2013    Past Medical History:  Diagnosis Date  . Uterine fibroid     Past Surgical History:  Procedure Laterality Date  . ABDOMINAL HYSTERECTOMY  10/22/2017  . UTERINE FIBROID SURGERY      Social History   Socioeconomic History  . Marital status: Married    Spouse name: Not on file  . Number of children: Not on file  . Years of education: Not on file  . Highest education level: Not on file  Occupational History  . Not on file  Social Needs  . Financial resource strain: Not on file  . Food insecurity    Worry: Not on file    Inability: Not on file  . Transportation needs    Medical: Not on file    Non-medical: Not on file  Tobacco Use  . Smoking status: Never Smoker  . Smokeless tobacco: Never Used  Substance and Sexual Activity  . Alcohol use: No  . Drug use: No  . Sexual activity: Not on file  Lifestyle  . Physical activity    Days per week: Not on file    Minutes per session: Not on file  . Stress: Not on file  Relationships  . Social Herbalist  on phone: Not on file    Gets together: Not on file    Attends religious service: Not on file    Active member of club or organization: Not on file    Attends meetings of clubs or organizations: Not on file    Relationship status: Not on file  . Intimate partner violence    Fear of current or ex partner: Not on file    Emotionally abused: Not on file    Physically abused: Not on file    Forced sexual activity: Not on file  Other Topics Concern  . Not on file  Social History Narrative   ** Merged History Encounter **        History reviewed. No pertinent family history.   Review of Systems  Constitutional: Negative.  Negative for chills and fever.  HENT: Negative.  Negative for congestion and sore throat.   Respiratory: Negative.  Negative for cough and shortness of breath.   Cardiovascular: Negative.  Negative for chest pain and palpitations.  Gastrointestinal: Negative.  Negative for abdominal pain, blood in stool, diarrhea, melena, nausea and vomiting.  Genitourinary: Positive for dysuria, frequency and urgency. Negative for flank pain and hematuria.  Musculoskeletal: Negative.  Negative for back pain, myalgias and neck  pain.  Skin: Negative.  Negative for rash.  Neurological: Negative for dizziness and headaches.  All other systems reviewed and are negative.  Vitals:   10/17/19 1631  BP: 116/68  Pulse: 60  Resp: 16  Temp: 98.1 F (36.7 C)  SpO2: 98%     Physical Exam Vitals signs reviewed.  Constitutional:      Appearance: Normal appearance.  HENT:     Head: Normocephalic.  Eyes:     Extraocular Movements: Extraocular movements intact.     Conjunctiva/sclera: Conjunctivae normal.     Pupils: Pupils are equal, round, and reactive to light.  Neck:     Musculoskeletal: Normal range of motion and neck supple.  Cardiovascular:     Rate and Rhythm: Normal rate and regular rhythm.     Heart sounds: Normal heart sounds.  Pulmonary:     Effort: Pulmonary effort  is normal.     Breath sounds: Normal breath sounds.  Abdominal:     General: There is no distension.     Palpations: Abdomen is soft.     Tenderness: There is no abdominal tenderness. There is no right CVA tenderness or left CVA tenderness.  Musculoskeletal: Normal range of motion.  Skin:    General: Skin is warm and dry.     Capillary Refill: Capillary refill takes less than 2 seconds.  Neurological:     General: No focal deficit present.     Mental Status: She is alert and oriented to person, place, and time.  Psychiatric:        Mood and Affect: Mood normal.        Behavior: Behavior normal.    Results for orders placed or performed in visit on 10/17/19 (from the past 24 hour(s))  POCT urinalysis dipstick     Status: Abnormal   Collection Time: 10/17/19  4:41 PM  Result Value Ref Range   Color, UA yellow yellow   Clarity, UA clear clear   Glucose, UA negative negative mg/dL   Bilirubin, UA negative negative   Ketones, POC UA negative negative mg/dL   Spec Grav, UA >=1.030 (A) 1.010 - 1.025   Blood, UA small (A) negative   pH, UA 5.5 5.0 - 8.0   Protein Ur, POC negative negative mg/dL   Urobilinogen, UA 0.2 0.2 or 1.0 E.U./dL   Nitrite, UA Negative Negative   Leukocytes, UA Negative Negative  POCT urine pregnancy     Status: Normal   Collection Time: 10/17/19  4:42 PM  Result Value Ref Range   Preg Test, Ur Negative Negative  POCT Microscopic Urinalysis ([facility])     Status: Abnormal   Collection Time: 10/17/19  4:53 PM  Result Value Ref Range   WBC,UR,HPF,POC None None WBC/hpf   RBC,UR,HPF,POC Few (A) None RBC/hpf   Bacteria Moderate (A) None, Too numerous to count   Mucus Absent Absent   Epithelial Cells, UR Per Microscopy Few (A) None, Too numerous to count cells/hpf     ASSESSMENT & PLAN: [place] was seen today for urinary frequency.  Diagnoses and all orders for this visit:  Acute UTI -     sulfamethoxazole-trimethoprim (BACTRIM DS) 800-160 MG tablet; Take 1  tablet by mouth 2 (two) times daily for 7 days.  Urinary frequency -     POCT urinalysis dipstick -     POCT Microscopic Urinalysis ([facility]) -     POCT urine pregnancy -     Urine culture -     CBC with Differential -  Comprehensive metabolic panel -     Lipid panel  Other orders -     fluticasone (FLONASE) 50 MCG/ACT nasal spray; Place 2 sprays into both nostrils daily.    Patient Instructions   We recommend that you schedule a mammogram for breast cancer screening. Typically, you do not need a referral to do this. Please contact a local imaging center to schedule your mammogram.  [facility] - [phone]  *ask for the Radiology [place] ([place]) - [phone] or [phone]  [facility] - [phone] [place] [phone] [facility]  - [phone]  *ask for the [facility] - [phone]  *ask for the Radiology Department [facility] - [phone]  *ask for the [place] - [phone]    If you have lab work done today you will be contacted with your lab results within the next 2 weeks.  If you have not heard from [place] then please contact us. The fastest way to get your results is to register for My Chart.   IF you received an x-ray today, you will receive an invoice from [facility] Radiology. Please contact [facility] Radiology at [phone] with questions or concerns regarding your invoice.   IF you received labwork today, you will receive an invoice from [place]. Please contact [facility] at [phone] with questions or concerns regarding your invoice.   Our billing staff will not be able to assist you with questions regarding bills from these companies.  You will be contacted with the lab results as soon as they are available. The fastest way to get your results is to activate your My Chart  account. Instructions are located on the last page of this paperwork. If you have not heard from [place] regarding the results in 2 weeks, please contact this office.     Infeccin urinaria en los adultos Urinary Tract Infection, Adult Una infeccin urinaria (IU) puede ocurrir en Clinical cytogeneticist de las vas [place]. Las vas urinarias incluyen lo siguiente:  Los riones.  Los urteres.  La vejiga.  La uretra. Estos rganos fabrican, almacenan y eliminan el pis (orina) del cuerpo. Cules son las causas? La causa es la presencia de grmenes (bacterias) en la zona genital. Estos grmenes proliferan y causan hinchazn (inflamacin) de las vas urinarias. Qu incrementa el riesgo? Es ms probable que contraiga esta afeccin si:  Tiene colocado un tubo delgado y pequeo (catter) para drenar el pis.  Nopuede controlar la evacuacin de pis ni de materia fecal (incontinencia).  Es Art therapist y, adems: ? [place] estos mtodos para Therapist, occupational: ? Un medicamento que [employer] espermatozoides (espermicida). ? Un dispositivo que impide el paso de los espermatozoides (diafragma). ? Tieneniveles bajos de una hormona femenina (estrgeno). ? Est embarazada.  Tiene genes que [employer].  Es sexualmente activa.  Toma antibiticos.  Tiene dificultad para orinar debido a: ? Su prstata es ms grande de lo normal, si usted es hombre. ? Obstruccin en la parte del cuerpo que drena el pis de la vejiga (uretra). ? Clculo renal. ? Untrastorno nervioso que afecta la vejiga (vejiga neurgena). ? No bebe una cantidad suficiente de lquido. ? No hace pis con la frecuencia suficiente.  Tiene otras afecciones, como: ? Diabetes. ? Un sistema que combate las enfermedades (sistema inmunitario) debilitado. ? Anemia drepanoctica. ? Gota. ? Lesin en la columna vertebral. Cules son los signos o los sntomas? Los  sntomas de esta afeccin incluyen:  Necesidad inmediata (urgente) de hacer  pis.  Hacer pis con frecuencia.  Hacer poca cantidad de pis con mucha frecuencia.  Dolor o ardor al [employer].  Sangre en el pis.  Pis que huele mal o anormal.  Dificultad para hacer pis.  Pis turbio.  Lquido que sale de la vagina, si es [place].  Dolor en la barriga o en la parte baja de la espalda. Otros sntomas pueden incluir los siguientes:  Vmitos.  No sentir deseos de comer.  Sentirse confundido (confuso).  Sentirse cansado y malhumorado (irritable).  Cristy Hilts.  Materia fecal lquida (diarrea). Cmo se trata? El tratamiento de esta afeccin puede incluir:  Antibiticos.  Otros medicamentos.  Beber una cantidad suficiente de agua. Sigue estas instrucciones en tu casa:  Medicamentos  [employer] de venta libre y los recetados solamente como se lo haya indicado el mdico.  Si le recetaron un antibitico, tmelo como se lo haya indicado el mdico. No deje de tomarlo aunque comience a sentirse mejor. Indicaciones generales  Asegrese de hacer lo siguiente: ? Haga pis hasta que la vejiga quede vaca. ? Nocontenga el pis durante mucho tiempo. ? Vace la vejiga despus de Clinical biochemist. ? Lmpiese de adelante hacia atrs despus de defecar, si es mujer. Use cada trozo de papel higinico solo una vez cuando se limpie.  Beba suficiente lquido como para Theatre manager la orina de color amarillo plido.  Concurra a todas las visitas de seguimiento como se lo haya indicado el mdico. Esto es importante. Comunquese con un mdico si:  No mejora despus de 1 o 2das de tratamiento.  Los sntomas desaparecen y Teacher, adult education. Solicite ayuda inmediatamente si:  Tiene un dolor muy intenso en la espalda.  Tiene dolor muy intenso en la parte baja de la barriga.  Tener fiebre.  Tiene Higher education careers adviser (nuseas).  Tiene vmitos. Resumen  Una infeccin urinaria (IU) puede ocurrir en Clinical cytogeneticist de las vas [place].  Esta  afeccin es causada por la presencia de grmenes en la zona genital.  Existen muchos factores de riesgo de sufrir una IU. Estos incluyen tener colocado un tubo delgado y pequeo para drenar el pis y no poder controlar cundo hace pis y materia fecal.  El tratamiento incluye antibiticos contra los grmenes.  Beba suficiente lquido como para Theatre manager la orina de color amarillo plido. Esta informacin no tiene Marine scientist el consejo del mdico. Asegrese de hacerle al mdico cualquier pregunta que tenga. Document Released: 04/14/2010 Document Revised: 10/18/2018 Document Reviewed: 10/18/2018 Elsevier Patient Education  2020 Elsevier Inc.      Agustina Caroli, MD [facility]

## 2019-10-18 ENCOUNTER — Telehealth: Payer: Self-pay | Admitting: Emergency Medicine

## 2019-10-18 LAB — CBC WITH DIFFERENTIAL/PLATELET
Basophils Absolute: 0 10*3/uL (ref 0.0–0.2)
Basos: 1 %
EOS (ABSOLUTE): 0.1 10*3/uL (ref 0.0–0.4)
Eos: 1 %
Hematocrit: 42.3 % (ref 34.0–46.6)
Hemoglobin: 14.3 g/dL (ref 11.1–15.9)
Immature Grans (Abs): 0 10*3/uL (ref 0.0–0.1)
Immature Granulocytes: 0 %
Lymphocytes Absolute: 2.7 10*3/uL (ref 0.7–3.1)
Lymphs: 43 %
MCH: 30.2 pg (ref 26.6–33.0)
MCHC: 33.8 g/dL (ref 31.5–35.7)
MCV: 89 fL (ref 79–97)
Monocytes Absolute: 0.4 10*3/uL (ref 0.1–0.9)
Monocytes: 6 %
Neutrophils Absolute: 3.2 10*3/uL (ref 1.4–7.0)
Neutrophils: 49 %
Platelets: 313 10*3/uL (ref 150–450)
RBC: 4.73 x10E6/uL (ref 3.77–5.28)
RDW: 12.3 % (ref 11.7–15.4)
WBC: 6.4 10*3/uL (ref 3.4–10.8)

## 2019-10-18 LAB — LIPID PANEL
Chol/HDL Ratio: 4 ratio (ref 0.0–4.4)
Cholesterol, Total: 186 mg/dL (ref 100–199)
HDL: 46 mg/dL (ref >39)
LDL Chol Calc (NIH): 126 mg/dL — ABNORMAL HIGH (ref 0–99)
Triglycerides: 78 mg/dL (ref 0–149)
VLDL Cholesterol Cal: 14 mg/dL (ref 5–40)

## 2019-10-18 LAB — COMPREHENSIVE METABOLIC PANEL
ALT: 19 IU/L (ref 0–32)
AST: 17 IU/L (ref 0–40)
Albumin/Globulin Ratio: 1.8 (ref 1.2–2.2)
Albumin: 4.4 g/dL (ref 3.8–4.8)
Alkaline Phosphatase: 107 IU/L (ref 39–117)
BUN/Creatinine Ratio: 16 (ref 9–23)
BUN: 11 mg/dL (ref 6–24)
Bilirubin Total: 0.2 mg/dL (ref 0.0–1.2)
CO2: 22 mmol/L (ref 20–29)
Calcium: 9.7 mg/dL (ref 8.7–10.2)
Chloride: 105 mmol/L (ref 96–106)
Creatinine, Ser: 0.7 mg/dL (ref 0.57–1.00)
GFR calc Af Amer: 119 mL/min/{1.73_m2} (ref >59)
GFR calc non Af Amer: 104 mL/min/{1.73_m2} (ref >59)
Globulin, Total: 2.5 g/dL (ref 1.5–4.5)
Glucose: 96 mg/dL (ref 65–99)
Potassium: 4.3 mmol/L (ref 3.5–5.2)
Sodium: 140 mmol/L (ref 134–144)
Total Protein: 6.9 g/dL (ref 6.0–8.5)

## 2019-10-18 LAB — URINE CULTURE

## 2019-10-18 NOTE — Telephone Encounter (Signed)
Blood results discussed with husband and wife.

## 2021-04-15 NOTE — Progress Notes (Signed)
Subjective:    Patient ID: Rebecca Moss, female    DOB: 1971-02-10, 50 y.o.   MRN: [id number]  HPI The patient is here for an acute visit.   Left Side/lower back pain and malodorous urine.  The malodorous urine has been doing on for months.  No dysuria, vaginal d/c. She is s/p hysterectomy.  Back is intermittent - left lower back and side. No abdominal pain, fever, nausea.     She has a h/o similar symptoms - last one was 10/2019 and at that time she had left side pain, urine odor and freq.  Improved with bactrim.  Urine cx was neg.      Medications and allergies reviewed with patient and updated if appropriate.  Patient Active Problem List   Diagnosis Date Noted   Closed TBI (traumatic brain injury) ([facility]) 01/03/2013    Current Outpatient Medications on File Prior to Visit  Medication Sig Dispense Refill   fluticasone (FLONASE) 50 MCG/ACT nasal spray Place 2 sprays into both nostrils daily. 16 g 6   No current facility-administered medications on file prior to visit.    Past Medical History:  Diagnosis Date   Uterine fibroid     Past Surgical History:  Procedure Laterality Date   ABDOMINAL HYSTERECTOMY  10/22/2017   UTERINE FIBROID SURGERY      Social History   Socioeconomic History   Marital status: Married    Spouse name: Not on file   Number of children: Not on file   Years of education: Not on file   Highest education level: Not on file  Occupational History   Not on file  Tobacco Use   Smoking status: Never   Smokeless tobacco: Never  Substance and Sexual Activity   Alcohol use: No   Drug use: No   Sexual activity: Not on file  Other Topics Concern   Not on file  Social History Narrative   ** Merged History Encounter **       Social Determinants of Health   Financial Resource Strain: Not on file  Food Insecurity: Not on file  Transportation Needs: Not on file  Physical Activity: Not on file  Stress: Not on file  Social Connections: Not on  file    History reviewed. No pertinent family history.  Review of Systems  Constitutional:  Negative for fever.  Gastrointestinal:  Negative for abdominal pain and nausea.  Genitourinary:  Positive for flank pain (left side) and frequency. Negative for dysuria and hematuria.       Urine has odor      Objective:   Vitals:   04/16/21 1035  BP: 118/78  Pulse: 63  Temp: 98.8 F (37.1 C)  SpO2: 98%   BP Readings from Last 3 Encounters:  04/16/21 118/78  10/17/19 116/68  08/11/12 110/68   Wt Readings from Last 3 Encounters:  04/16/21 182 lb 9.6 oz (82.8 kg)  10/17/19 174 lb (78.9 kg)  08/11/12 161 lb (73 kg)   Body mass index is 32.35 kg/m.   Physical Exam Constitutional:      General: She is not in acute distress.    Appearance: Normal appearance. She is not ill-appearing.  HENT:     Head: Normocephalic and atraumatic.  Abdominal:     General: There is no distension.     Palpations: Abdomen is soft.     Tenderness: There is no abdominal tenderness. There is no right CVA tenderness, left CVA tenderness, guarding or rebound.  Musculoskeletal:  General: No tenderness (lumbar spine and lower back).     Right lower leg: No edema.     Left lower leg: No edema.  Skin:    General: Skin is warm and dry.     Findings: No rash.  Neurological:     Mental Status: She is alert.           Assessment & Plan:    See Problem List for Assessment and Plan of chronic medical problems.    This visit occurred during the SARS-CoV-2 public health emergency.  Safety protocols were in place, including screening questions prior to the visit, additional usage of staff PPE, and extensive cleaning of exam room while observing appropriate contact time as indicated for disinfecting solutions.

## 2021-04-16 ENCOUNTER — Ambulatory Visit (INDEPENDENT_AMBULATORY_CARE_PROVIDER_SITE_OTHER): Payer: Self-pay | Admitting: Internal Medicine

## 2021-04-16 ENCOUNTER — Encounter: Payer: Self-pay | Admitting: Internal Medicine

## 2021-04-16 ENCOUNTER — Other Ambulatory Visit: Payer: Self-pay

## 2021-04-16 VITALS — BP 118/78 | HR 63 | Temp 98.8°F | Ht 63.0 in | Wt 182.6 lb

## 2021-04-16 DIAGNOSIS — M545 Low back pain, unspecified: Secondary | ICD-10-CM

## 2021-04-16 DIAGNOSIS — R8290 Unspecified abnormal findings in urine: Secondary | ICD-10-CM | POA: Insufficient documentation

## 2021-04-16 LAB — POC URINALSYSI DIPSTICK (AUTOMATED)
Bilirubin, UA: NEGATIVE
Blood, UA: 1
Glucose, UA: NEGATIVE
Ketones, UA: NEGATIVE
Leukocytes, UA: NEGATIVE
Nitrite, UA: NEGATIVE
Protein, UA: NEGATIVE
Spec Grav, UA: 1.025 (ref 1.010–1.025)
Urobilinogen, UA: 0.2 E.U./dL
pH, UA: 6 (ref 5.0–8.0)

## 2021-04-16 LAB — MICROALBUMIN / CREATININE URINE RATIO
Creatinine,U: 90.2 mg/dL
Microalb Creat Ratio: 0.8 mg/g (ref 0.0–30.0)
Microalb, Ur: <0.7 mg/dL (ref 0.0–1.9)

## 2021-04-16 NOTE — Assessment & Plan Note (Signed)
Has intermittent left lower back/side pain Seems to occur more when in bed or at work  -- not while walking around ? msk in nature, renal stone seems less likely  has not taken anything for it Advised to try tylenol or advil for pain relief if needed

## 2021-04-16 NOTE — Assessment & Plan Note (Signed)
She has been experiencing this for months, no other urinary symptoms, abd pain or nausea Not taking any vitamins, no vaginal d/c, drinks a lot of water ? Cause Urine dip neg for infection, 1 + blood Check UA, ucx No treatment at this time unless cx is pos

## 2021-04-16 NOTE — Patient Instructions (Addendum)
   Your urine does not show an infection.  This will be sent for a culture.   Try taking tylenol or advil for the pain to see if it helps.

## 2021-04-16 NOTE — Addendum Note (Signed)
Addended by: Marcina Millard on: 04/16/2021 05:02 PM   Modules accepted: Orders

## 2021-04-18 LAB — CULTURE, URINE COMPREHENSIVE

## 2021-06-17 ENCOUNTER — Ambulatory Visit (INDEPENDENT_AMBULATORY_CARE_PROVIDER_SITE_OTHER): Payer: Self-pay | Admitting: Emergency Medicine

## 2021-06-17 ENCOUNTER — Other Ambulatory Visit: Payer: Self-pay

## 2021-06-17 ENCOUNTER — Encounter: Payer: Self-pay | Admitting: Emergency Medicine

## 2021-06-17 VITALS — BP 118/68 | HR 66 | Temp 98.7°F | Ht 63.0 in | Wt 180.0 lb

## 2021-06-17 DIAGNOSIS — R109 Unspecified abdominal pain: Secondary | ICD-10-CM

## 2021-06-17 DIAGNOSIS — G4489 Other headache syndrome: Secondary | ICD-10-CM | POA: Insufficient documentation

## 2021-06-17 DIAGNOSIS — Z7689 Persons encountering health services in other specified circumstances: Secondary | ICD-10-CM

## 2021-06-17 DIAGNOSIS — Z130 Encounter for screening for diseases of the blood and blood-forming organs and certain disorders involving the immune mechanism: Secondary | ICD-10-CM

## 2021-06-17 DIAGNOSIS — G8929 Other chronic pain: Secondary | ICD-10-CM | POA: Insufficient documentation

## 2021-06-17 LAB — HEMOGLOBIN A1C: Hgb A1c MFr Bld: 6 % (ref 4.6–6.5)

## 2021-06-17 LAB — CBC WITH DIFFERENTIAL/PLATELET
Basophils Absolute: 0 10*3/uL (ref 0.0–0.1)
Basophils Relative: 0.4 % (ref 0.0–3.0)
Eosinophils Absolute: 0 10*3/uL (ref 0.0–0.7)
Eosinophils Relative: 0.4 % (ref 0.0–5.0)
HCT: 40.1 % (ref 36.0–46.0)
Hemoglobin: 13.8 g/dL (ref 12.0–15.0)
Lymphocytes Relative: 27.8 % (ref 12.0–46.0)
Lymphs Abs: 1.8 10*3/uL (ref 0.7–4.0)
MCHC: 34.3 g/dL (ref 30.0–36.0)
MCV: 87 fl (ref 78.0–100.0)
Monocytes Absolute: 0.4 10*3/uL (ref 0.1–1.0)
Monocytes Relative: 6.5 % (ref 3.0–12.0)
Neutro Abs: 4.2 10*3/uL (ref 1.4–7.7)
Neutrophils Relative %: 64.9 % (ref 43.0–77.0)
Platelets: 284 10*3/uL (ref 150.0–400.0)
RBC: 4.61 Mil/uL (ref 3.87–5.11)
RDW: 13.2 % (ref 11.5–15.5)
WBC: 6.4 10*3/uL (ref 4.0–10.5)

## 2021-06-17 LAB — LIPID PANEL
Cholesterol: 196 mg/dL (ref 0–200)
HDL: 44.2 mg/dL (ref >39.00)
LDL Cholesterol: 127 mg/dL — ABNORMAL HIGH (ref 0–99)
NonHDL: 151.6
Total CHOL/HDL Ratio: 4
Triglycerides: 124 mg/dL (ref 0.0–149.0)
VLDL: 24.8 mg/dL (ref 0.0–40.0)

## 2021-06-17 LAB — COMPREHENSIVE METABOLIC PANEL
ALT: 20 U/L (ref 0–35)
AST: 17 U/L (ref 0–37)
Albumin: 4.3 g/dL (ref 3.5–5.2)
Alkaline Phosphatase: 89 U/L (ref 39–117)
BUN: 13 mg/dL (ref 6–23)
CO2: 24 mEq/L (ref 19–32)
Calcium: 9.6 mg/dL (ref 8.4–10.5)
Chloride: 105 mEq/L (ref 96–112)
Creatinine, Ser: 0.67 mg/dL (ref 0.40–1.20)
GFR: 102.86 mL/min (ref >60.00)
Glucose, Bld: 94 mg/dL (ref 70–99)
Potassium: 4.1 mEq/L (ref 3.5–5.1)
Sodium: 139 mEq/L (ref 135–145)
Total Bilirubin: 0.4 mg/dL (ref 0.2–1.2)
Total Protein: 7.2 g/dL (ref 6.0–8.3)

## 2021-06-17 LAB — TSH: TSH: 1.77 u[IU]/mL (ref 0.35–5.50)

## 2021-06-17 NOTE — Assessment & Plan Note (Signed)
Benign intermittent headaches.  Borderline migraine at times.  No red flag signs or symptoms.  Advised to take Tylenol and or Advil as needed.

## 2021-06-17 NOTE — Patient Instructions (Signed)
Mantenimiento de Technical sales engineer en [employer], Female Adoptar un estilo de vida saludable y recibir atencin preventiva son importantes para promover la salud y Musician. Consulte al mdico sobre: El esquema adecuado para hacerse pruebas y exmenes peridicos. Cosas que puede hacer por su cuenta para prevenir enfermedades y [employer]. Qu debo saber sobre la dieta, el peso y el ejercicio? Consuma una dieta saludable  Consuma una dieta que incluya muchas verduras, frutas, productos lcteos con bajo contenido de [place] y Advertising account planner. No consuma muchos alimentos ricos en grasas slidas, azcares agregados o sodio.  Mantenga un peso saludable El ndice de masa muscular [facility]) se [place] para identificar problemas de [place]. Proporciona una estimacin de la grasa corporal basndose en el peso y la altura. Su mdico puede ayudarle a Radiation protection practitioner [place] y a Scientist, forensic o Theatre manager unpeso saludable. Haga ejercicio con regularidad Haga ejercicio con regularidad. Esta es una de las prcticas ms importantes que puede hacer por su salud. La [employer] de los adultos deben seguir estas pautas: Optometrist, al menos, 150 minutos de actividad fsica por semana. El ejercicio debe aumentar la frecuencia cardaca y Nature conservation officer transpirar (ejercicio de intensidad moderada). Hacer ejercicios de fortalecimiento por lo [employer] por semana. Agregue esto a su plan de ejercicio de intensidad moderada. Pasar menos tiempo sentados. Incluso la actividad fsica ligera puede ser beneficiosa. Controle sus niveles de colesterol y lpidos en la sangre Comience a realizarse anlisis de lpidos y colesterol en la sangre a los20 aos y luego reptalos cada 5 aos. Hgase controlar los niveles de colesterol con mayor frecuencia si: Sus niveles de lpidos y colesterol son altos. Es mayor de 51 aos. Presenta un alto riesgo de padecer enfermedades cardacas. Qu debo saber sobre las pruebas de deteccin del  cncer? Segn su historia clnica y sus antecedentes familiares, es posible que deba realizarse pruebas de deteccin del cncer en diferentes edades. Esto puede incluir pruebas de deteccin de lo siguiente: Cncer de mama. Cncer de cuello uterino. Cncer colorrectal. Cncer de piel. Cncer de pulmn. Qu debo saber sobre la enfermedad cardaca, la diabetes y la hipertensinarterial? Presin arterial y enfermedad cardaca La hipertensin arterial causa enfermedades cardacas y [place] el riesgo de accidente cerebrovascular. Es ms probable que esto se manifieste en las personas que tienen lecturas de presin arterial alta, tienen ascendencia africana o tienen sobrepeso. Hgase controlar la presin arterial: Cada 3 a 5 aos si tiene entre 18 y 25 aos. Todos los aos si es mayor de 40 aos. Diabetes Realcese exmenes de deteccin de la diabetes con regularidad. Este anlisis revisa el nivel de azcar en la sangre en [place]. Hgase las pruebas de deteccin: Cada tres aos despus de los 89 aos de edad si tiene un peso normal y un bajo riesgo de padecer diabetes. Con ms frecuencia y a partir de [place] edad inferior si tiene sobrepeso o un alto riesgo de padecer diabetes. Qu debo saber sobre la prevencin de infecciones? Hepatitis B Si tiene un riesgo ms alto de contraer hepatitis B, debe someterse a un examen de deteccin de este virus. Hable con el mdico para averiguar si tiene riesgode contraer la infeccin por hepatitis B. Hepatitis C Se recomienda el anlisis a: [employer] 1945 y 1965. Todas las personas que tengan un riesgo de haber contrado hepatitis C. Enfermedades de transmisin sexual (ETS) Hgase las pruebas de Programme researcher, broadcasting/film/video de ITS, incluidas la gonorrea y la clamidia, si: Es sexualmente activa y es Garment/textile technologist de 24  aos. Es mayor de 1 aos, y el mdico le informa que corre riesgo de tener este tipo de infecciones. La actividad sexual ha cambiado desde que le  hicieron la ltima prueba de deteccin y tiene un riesgo mayor de Best boy clamidia o Radio broadcast assistant. Pregntele al mdico si usted tiene riesgo. Pregntele al mdico si usted tiene un alto riesgo de Museum/gallery curator VIH. El mdico tambin puede recomendarle un medicamento recetado para ayudar a evitar la infeccin por el VIH. Si elige tomar medicamentos para prevenir el VIH, primero debe [employer] de deteccin del VIH. Luego debe hacerse anlisis cada 3 meses mientras est tomando los medicamentos. Embarazo Si est por dejar de Librarian, academic (fase premenopusica) y usted puede quedar [place], busque asesoramiento antes de [place]. Tome de 400 a 800 microgramos (mcg) de cido [employer] si [place]. Pida mtodos de control de la natalidad (anticonceptivos) si desea evitar un embarazo no deseado. Osteoporosis y [place] La osteoporosis es una enfermedad en la que los huesos pierden los minerales y la fuerza por el avance de la edad. El resultado pueden ser fracturas en los [place]. Si tiene 67 aos o ms, o si est en riesgo de sufrir osteoporosis y fracturas, pregunte a su mdico si debe: Hacerse pruebas de deteccin de prdida sea. Tomar un suplemento de calcio o de vitamina D para reducir el riesgo de fracturas. Recibir terapia de reemplazo hormonal (TRH) para tratar los sntomas de la menopausia. Siga estas instrucciones en su casa: Estilo de vida No consuma ningn producto que contenga nicotina o tabaco, como cigarrillos, cigarrillos electrnicos y tabaco de Higher education careers adviser. Si necesita ayuda para dejar de fumar, consulte al mdico. No consuma drogas. No comparta agujas. Solicite ayuda a su mdico si necesita apoyo o informacin para abandonar las drogas. Consumo de alcohol No beba alcohol si: Su mdico le indica no hacerlo. Est embarazada, puede estar embarazada o est tratando de [place]. Si bebe alcohol: Limite la cantidad que consume de 0 a 1 medida por  da. Limite la ingesta si est amamantando. Est atento a la cantidad de alcohol que hay en las bebidas que toma. En los [place], una medida equivale a una botella de cerveza de 12 oz (355 ml), un vaso de vino de 5 oz (148 ml) o un vaso de una bebida alcohlica de alta graduacin de 1 oz (44 ml). Instrucciones generales Realcese los estudios de rutina de la salud, dentales y de Public librarian. [place]. Infrmele a su mdico si: Se siente deprimida con frecuencia. Alguna vez ha sido vctima de [place] o no se siente segura en su casa. Resumen Adoptar un estilo de vida saludable y recibir atencin preventiva son importantes para promover la salud y Musician. Siga las instrucciones del mdico acerca de una dieta saludable, el ejercicio y la realizacin de pruebas o exmenes para Engineer, building services. Siga las instrucciones del mdico con respecto al control del colesterol y la presin arterial. Esta informacin no tiene Marine scientist el consejo del mdico. Asegresede hacerle al mdico cualquier pregunta que tenga. Document Revised: 11/15/2018 Document Reviewed: 11/15/2018 Elsevier Patient Education  [place].

## 2021-06-17 NOTE — Assessment & Plan Note (Signed)
Musculoskeletal in nature.  Advised to take Tylenol and or Advil as needed. No red flag signs or symptoms.

## 2021-06-17 NOTE — Progress Notes (Signed)
Rebecca Moss 50 y.o.   Chief Complaint  Patient presents with   Establish Care    Flank pain, left side. Pt states she has headaches at times.    HISTORY OF PRESENT ILLNESS: This is a 50 y.o. female here to establish care with me. Has occasional episodes of left flank pain.  Short lived episodes related to physical activity.  No other associated symptoms. Also complaining of occasional short-lived left-sided frontal headaches lasting only minutes with no additional symptoms.  No history of migraines.  No other complaints or medical concerns today.  HPI   Prior to Admission medications   Medication Sig Start Date End Date Taking? Authorizing Provider  fluticasone (FLONASE) 50 MCG/ACT nasal spray Place 2 sprays into both nostrils daily. 10/17/19  Yes Horald Pollen, MD    No Known Allergies  Patient Active Problem List   Diagnosis Date Noted   Malodorous urine 04/16/2021   Left-sided low back pain without sciatica 04/16/2021   Closed TBI (traumatic brain injury) ([facility]) 01/03/2013    Past Medical History:  Diagnosis Date   Uterine fibroid     Past Surgical History:  Procedure Laterality Date   ABDOMINAL HYSTERECTOMY  10/22/2017   UTERINE FIBROID SURGERY      Social History   Socioeconomic History   Marital status: Married    Spouse name: Not on file   Number of children: Not on file   Years of education: Not on file   Highest education level: Not on file  Occupational History   Not on file  Tobacco Use   Smoking status: Never   Smokeless tobacco: Never  Substance and Sexual Activity   Alcohol use: No   Drug use: No   Sexual activity: Not on file  Other Topics Concern   Not on file  Social History Narrative   ** Merged History Encounter **       Social Determinants of Health   Financial Resource Strain: Not on file  Food Insecurity: Not on file  Transportation Needs: Not on file  Physical Activity: Not on file  Stress: Not on file  Social  Connections: Not on file  Intimate Partner Violence: Not on file    No family history on file.   Review of Systems  Constitutional: Negative.  Negative for chills and fever.  HENT: Negative.  Negative for congestion and sore throat.   Respiratory: Negative.  Negative for cough and shortness of breath.   Cardiovascular:  Negative for chest pain and palpitations.  Gastrointestinal:  Negative for abdominal pain, diarrhea, nausea and vomiting.  Genitourinary: Negative.  Negative for dysuria, frequency, hematuria and urgency.  Musculoskeletal:  Positive for back pain.  Skin: Negative.  Negative for rash.  Neurological:  Negative for dizziness and headaches.  All other systems reviewed and are negative.  Today's Vitals   06/17/21 1117  BP: 118/68  Pulse: 66  Temp: 98.7 F (37.1 C)  TempSrc: Oral  SpO2: 97%  Weight: 180 lb (81.6 kg)  Height: '5\' 3"'$  (1.6 m)   Body mass index is 31.89 kg/m. Wt Readings from Last 3 Encounters:  06/17/21 180 lb (81.6 kg)  04/16/21 182 lb 9.6 oz (82.8 kg)  10/17/19 174 lb (78.9 kg)    Physical Exam Vitals reviewed.  Constitutional:      Appearance: Normal appearance.  HENT:     Head: Normocephalic.  Eyes:     Extraocular Movements: Extraocular movements intact.     Conjunctiva/sclera: Conjunctivae normal.  Pupils: Pupils are equal, round, and reactive to light.  Cardiovascular:     Rate and Rhythm: Normal rate and regular rhythm.     Pulses: Normal pulses.     Heart sounds: Normal heart sounds.  Pulmonary:     Effort: Pulmonary effort is normal.     Breath sounds: Normal breath sounds.  Abdominal:     Palpations: Abdomen is soft.     Tenderness: There is no abdominal tenderness.  Musculoskeletal:        General: Normal range of motion.     Cervical back: Normal range of motion and neck supple.  Skin:    General: Skin is warm and dry.     Capillary Refill: Capillary refill takes less than 2 seconds.  Neurological:     General: No  focal deficit present.     Mental Status: She is alert and oriented to person, place, and time.  Psychiatric:        Mood and Affect: Mood normal.        Behavior: Behavior normal.     ASSESSMENT & PLAN: Chronic left flank pain Musculoskeletal in nature.  Advised to take Tylenol and or Advil as needed. No red flag signs or symptoms.  Other headache syndrome Benign intermittent headaches.  Borderline migraine at times.  No red flag signs or symptoms.  Advised to take Tylenol and or Advil as needed.  [name] was seen today for establish care.  Diagnoses and all orders for this visit:  Chronic left flank pain -     CBC with Differential/Platelet -     Comprehensive metabolic panel -     Hemoglobin A1c -     TSH -     Lipid panel  Encounter to establish care  Screening for deficiency anemia  Other headache syndrome  Patient Instructions  Mantenimiento de la salud en [place] Maintenance, Female Adoptar un estilo de vida saludable y recibir atencin preventiva son importantes para promover la salud y Musician. Consulte al mdico sobre: El esquema adecuado para hacerse pruebas y exmenes peridicos. Cosas que puede hacer por su cuenta para prevenir enfermedades y [employer]. Qu debo saber sobre la dieta, el peso y el ejercicio? Consuma una dieta saludable  Consuma una dieta que incluya muchas verduras, frutas, productos lcteos con bajo contenido de [place] y Advertising account planner. No consuma muchos alimentos ricos en grasas slidas, azcares agregados o sodio.  Mantenga un peso saludable El ndice de masa muscular [facility]) se [place] and the [place] para identificar problemas de [place]. Proporciona una estimacin de la grasa corporal basndose en el peso y la altura. Su mdico puede ayudarle a Radiation protection practitioner [place] y a Scientist, forensic o Theatre manager unpeso saludable. Haga ejercicio con regularidad Haga ejercicio con regularidad. Esta es una de las prcticas ms importantes que puede hacer por su  salud. La [employer] de los adultos deben seguir estas pautas: Optometrist, al menos, 150 minutos de actividad fsica por semana. El ejercicio debe aumentar la frecuencia cardaca y Nature conservation officer transpirar (ejercicio de intensidad moderada). Hacer ejercicios de fortalecimiento por lo [employer] por semana. Agregue esto a su plan de ejercicio de intensidad moderada. Pasar menos tiempo sentados. Incluso la actividad fsica ligera puede ser beneficiosa. Controle sus niveles de colesterol y lpidos en la sangre Comience a realizarse anlisis de lpidos y colesterol en la sangre a los20 aos y luego reptalos cada 5 aos. Hgase controlar los niveles de colesterol con mayor frecuencia si: Sus niveles de lpidos y colesterol son  altos. Es mayor de 40 aos. Presenta un alto riesgo de padecer enfermedades cardacas. Qu debo saber sobre las pruebas de deteccin del cncer? Segn su historia clnica y sus antecedentes familiares, es posible que deba realizarse pruebas de deteccin del cncer en diferentes edades. Esto puede incluir pruebas de deteccin de lo siguiente: Cncer de mama. Cncer de cuello uterino. Cncer colorrectal. Cncer de piel. Cncer de pulmn. Qu debo saber sobre la enfermedad cardaca, la diabetes y la hipertensinarterial? Presin arterial y enfermedad cardaca La hipertensin arterial causa enfermedades cardacas y [place] el riesgo de accidente cerebrovascular. Es ms probable que esto se manifieste en las personas que tienen lecturas de presin arterial alta, tienen ascendencia africana o tienen sobrepeso. Hgase controlar la presin arterial: Cada 3 a 5 aos si tiene entre 18 y 76 aos. Todos los aos si es mayor de 40 aos. Diabetes Realcese exmenes de deteccin de la diabetes con regularidad. Este anlisis revisa el nivel de azcar en la sangre en [place]. Hgase las pruebas de deteccin: Cada tres aos despus de los 29 aos de edad si tiene un peso normal y un bajo riesgo  de padecer diabetes. Con ms frecuencia y a partir de [place] edad inferior si tiene sobrepeso o un alto riesgo de padecer diabetes. Qu debo saber sobre la prevencin de infecciones? Hepatitis B Si tiene un riesgo ms alto de contraer hepatitis B, debe someterse a un examen de deteccin de este virus. Hable con el mdico para averiguar si tiene riesgode contraer la infeccin por hepatitis B. Hepatitis C Se recomienda el anlisis a: [employer] 1945 y 1965. Todas las personas que tengan un riesgo de haber contrado hepatitis C. Enfermedades de transmisin sexual (ETS) Hgase las pruebas de Programme researcher, broadcasting/film/video de ITS, incluidas la gonorrea y la clamidia, si: Es sexualmente activa y es menor de 35 aos. Es mayor de 53 aos, y Investment banker, operational informa que corre riesgo de tener este tipo de infecciones. La actividad sexual ha cambiado desde que le hicieron la ltima prueba de deteccin y tiene un riesgo mayor de Best boy clamidia o Radio broadcast assistant. Pregntele al mdico si usted tiene riesgo. Pregntele al mdico si usted tiene un alto riesgo de Museum/gallery curator VIH. El mdico tambin puede recomendarle un medicamento recetado para ayudar a evitar la infeccin por el VIH. Si elige tomar medicamentos para prevenir el VIH, primero debe [employer] de deteccin del VIH. Luego debe hacerse anlisis cada 3 meses mientras est tomando los medicamentos. Embarazo Si est por dejar de Librarian, academic (fase premenopusica) y usted puede quedar [facility], busque asesoramiento antes de [place]. Tome de 400 a 800 microgramos (mcg) de cido [employer] si [place]. Pida mtodos de control de la natalidad (anticonceptivos) si desea evitar un embarazo no deseado. Osteoporosis y [place] La osteoporosis es una enfermedad en la que los huesos pierden los minerales y la fuerza por el avance de la edad. El resultado pueden ser fracturas en los [place]. Si tiene 41 aos o ms, o si est en riesgo de sufrir  osteoporosis y fracturas, pregunte a su mdico si debe: Hacerse pruebas de deteccin de prdida sea. Tomar un suplemento de calcio o de vitamina D para reducir el riesgo de fracturas. Recibir terapia de reemplazo hormonal (TRH) para tratar los sntomas de la menopausia. Siga estas instrucciones en su casa: Estilo de vida No consuma ningn producto que contenga nicotina o tabaco, como cigarrillos, cigarrillos electrnicos y tabaco de Higher education careers adviser. Si necesita ayuda para dejar de  fumar, consulte al mdico. No consuma drogas. No comparta agujas. Solicite ayuda a su mdico si necesita apoyo o informacin para abandonar las drogas. Consumo de alcohol No beba alcohol si: Su mdico le indica no hacerlo. Est embarazada, puede estar embarazada o est tratando de [place]. Si bebe alcohol: Limite la cantidad que consume de 0 a 1 medida por da. Limite la ingesta si est amamantando. Est atento a la cantidad de alcohol que hay en las bebidas que toma. En los [place], una medida equivale a una botella de cerveza de 12 oz (355 ml), un vaso de vino de 5 oz (148 ml) o un vaso de una bebida alcohlica de alta graduacin de 1 oz (44 ml). Instrucciones generales Realcese los estudios de rutina de la salud, dentales y de Public librarian. [place]. Infrmele a su mdico si: Se siente deprimida con frecuencia. Alguna vez ha sido vctima de [place] o no se siente segura en su casa. Resumen Adoptar un estilo de vida saludable y recibir atencin preventiva son importantes para promover la salud y Musician. Siga las instrucciones del mdico acerca de una dieta saludable, el ejercicio y la realizacin de pruebas o exmenes para Engineer, building services. Siga las instrucciones del mdico con respecto al control del colesterol y la presin arterial. Esta informacin no tiene Marine scientist el consejo del mdico. Asegresede hacerle al mdico cualquier pregunta que  tenga. Document Revised: 11/15/2018 Document Reviewed: 11/15/2018 Elsevier Patient Education  2022 [place], MD [facility]

## 2022-04-20 ENCOUNTER — Ambulatory Visit
Admission: EM | Admit: 2022-04-20 | Discharge: 2022-04-20 | Disposition: A | Discharge status: Home / Self Care | Payer: Self-pay | Attending: Emergency Medicine | Admitting: Emergency Medicine

## 2022-04-20 DIAGNOSIS — J209 Acute bronchitis, unspecified: Secondary | ICD-10-CM

## 2022-04-20 DIAGNOSIS — H66001 Acute suppurative otitis media without spontaneous rupture of ear drum, right ear: Secondary | ICD-10-CM

## 2022-04-20 DIAGNOSIS — J3089 Other allergic rhinitis: Secondary | ICD-10-CM

## 2022-04-20 MED ORDER — CETIRIZINE HCL 10 MG PO TABS: 10.0000 mg | ORAL_TABLET | Freq: Every day | ORAL | 2 refills | Status: AC

## 2022-04-20 MED ORDER — ALBUTEROL SULFATE HFA 108 (90 BASE) MCG/ACT IN AERS
2.0000 | INHALATION_SPRAY | Freq: Once | RESPIRATORY_TRACT | Status: AC
  Administered 2022-04-20: 2 via RESPIRATORY_TRACT

## 2022-04-20 MED ORDER — LOPERAMIDE HCL 2 MG PO TABS: ORAL_TABLET | ORAL | 0 refills | Status: DC

## 2022-04-20 MED ORDER — FLUTICASONE PROPIONATE 50 MCG/ACT NA SUSP: 1.0000 | Freq: Every day | NASAL | 1 refills | Status: DC

## 2022-04-20 MED ORDER — PROMETHAZINE-DM 6.25-15 MG/5ML PO SYRP: 5.0000 mL | ORAL_SOLUTION | Freq: Four times a day (QID) | ORAL | 0 refills | Status: DC | PRN

## 2022-04-20 MED ORDER — AMOXICILLIN-POT CLAVULANATE 875-125 MG PO TABS: 1.0000 | ORAL_TABLET | Freq: Two times a day (BID) | ORAL | 0 refills | Status: AC

## 2022-04-20 MED ORDER — FLUCONAZOLE 150 MG PO TABS: ORAL_TABLET | ORAL | 0 refills | Status: DC

## 2022-04-20 MED ORDER — IPRATROPIUM BROMIDE 0.06 % NA SOLN: 2.0000 | Freq: Three times a day (TID) | NASAL | 1 refills | Status: DC

## 2022-04-20 NOTE — Discharge Instructions (Signed)
Consulte la lista a continuacin para Curator recomendados, las dosis y las frecuencias para Public house manager sus sntomas actuales:  Augmentin (amoxicilina - cido clavulnico): tome [place] 10 das para eliminar la infeccin en su odo derecho, puede tomarla con o sin alimentos. Este antibitico puede causar Higher education careers adviser, esto se resolver una vez que se completen los antibiticos. Puede usar un probitico, Armed forces operational officer, tomar Imodium mientras toma este medicamento. Evite otros medicamentos sistmicos como Maalox, Pepto-Bismol o leche de magnesia, ya que pueden interferir con la capacidad de su cuerpo para Medical sales representative antibiticos.   Es muy importante que tome los antibiticos segn lo prescrito. Si omite dosis o no completa el ciclo completo de antibiticos, se expone a un riesgo significativo de infeccin recurrente, que a menudo puede ser peor que la infeccin inicial.   Diflucan (fluconazol): como puede o no saber, tomar antibiticos a menudo [employer] pacientes desarrollen una infeccin vaginal por hongos. Por este motivo, le he proporcionado una receta para Diflucan. Tome la primera tableta de Diflucan el da 3 o 4 de su terapia con antibiticos y tome la segunda tableta de Diflucan 3 das despus. No es necesario que recoja esta receta ni que tome este medicamento a menos que presente sntomas de candidiasis vaginal, como flujo vaginal espeso y blanco y/o picazn vaginal. Esta receta se ha proporcionado como cortesa y para su comodidad.  Zyrtec (cetirizina): este es un excelente antihistamnico de segunda generacin que ayuda a reducir la respuesta inflamatoria respiratoria a los alrgenos [place]. En algunos pacientes, este medicamento puede causar somnolencia diurna, por lo que le recomiendo que tome 1 tableta al da antes de [place].   Flonase (fluticasona): este es un aerosol nasal con esteroides que se [place] una vez al da, 1 aerosol en  cada orificio nasal. Este medicamento no funciona bien si decide usarlo solo cuando siente que lo necesita, funciona mejor si se [place] a diario. Despus de 3 a 5 das de [place], notar una reduccin significativa de la inflamacin y Engineer, materials produccin de mucosidad que actualmente est provocando la exposicin a los alrgenos, ya sean estacionales o ambientales. El efecto secundario ms comn de este medicamento es la hemorragia nasal. Si experimenta una hemorragia nasal, suspenda el uso durante 1 [place], luego sintase libre de Optician, dispensing. Le he proporcionado una receta.   Atrovent (ipratropio): este es un excelente aerosol descongestionante nasal que he agregado a su esteroide nasal recomendado que no causar una congestin de rebote, inculque 2 aerosoles en cada orificio nasal con cada uso. Debido a que los esteroides nasales pueden demorar varios das antes de que comiencen a brindar el beneficio completo, le recomiendo que use este aerosol adems del esteroide nasal recetado para usted. selo despus de haber usado su esteroide nasal y repita hasta 4 veces al da segn sea necesario. Le he proporcionado una receta para [employer].   ProAir, Ventolin, Proventil (albuterol): este medicamento inhalado contiene un broncodilatador beta agonista de accin corta. Este medicamento acta sobre el msculo liso que abre y Education officer, environmental las vas respiratorias al relajar el msculo. El resultado de la relajacin del msculo liso es un mayor movimiento del aire y un mejor trabajo respiratorio. Este es un medicamento de accin corta que se puede usar cada 4 a 6 horas segn sea necesario para el aumento del trabajo respiratorio, dificultad para Ambulance person, sibilancias y tos excesiva. Le he proporcionado una receta.  Prometazina DM: la prometazina es tanto un descongestionante nasal como  un medicamento contra las nuseas que hace que la mayora de los pacientes se sientan bastante somnolientos. El DM es dextrometorfano, un supresor de  la tos que se encuentra en muchos medicamentos para la tos de [place]. Tome 5 ml antes de acostarse para minimizar la tos, lo que lo ayudar a Advice worker. He enviado una receta para este medicamento a su farmacia.   Imodium (loperamida): Este es un medicamento antidiarreico. Tome este medicamento cada vez que tenga un episodio de [place]. No tome este medicamento ms de 4 veces al da.   Haga un seguimiento dentro de los prximos 5 a 7 das, ya sea con su proveedor de atencin primaria o con atencin de urgencia si sus sntomas no se resuelven. Si no tiene un proveedor de [employer], lo ayudaremos a Child psychotherapist.   Gracias por visitar la atencin de urgencia hoy. Agradecemos la oportunidad de Economist.   Please see the list below for recommended medications, dosages and frequencies to provide relief of your current symptoms:    Augmentin (amoxicillin - clavulanic acid):  take 1 tablet twice daily for 10 days to get rid of the infection in your right ear, you can take it with or without food.  This antibiotic can cause upset stomach, this will resolve once antibiotics are complete.  You are welcome to use a probiotic, eat yogurt, take Imodium while taking this medication.  Please avoid other systemic medications such as Maalox, Pepto-Bismol or milk of magnesia as they can interfere with your body's ability to absorb the antibiotics.   It is very important that you take antibiotics as prescribed.  If you skip doses or do not complete the full course of antibiotics, you put yourself at significant risk of recurrent infection which can often be worse than your initial infection.   Diflucan (fluconazole): As you may or may not be aware, taking antibiotics can often cause patients to develop a vaginal yeast infection.  For this reason, I have provided you with a prescription for Diflucan.  Please take the first Diflucan tablet on day 3 or 4 of your antibiotic therapy, and take  the second Diflucan tablet 3 days later.  You do not need to pick up this prescription or take this medication unless you develop symptoms of vaginal yeast infection including thick, white vaginal discharge and/or vaginal itching.  This prescription has been provided as a Manufacturing engineer and for your convenience.  Zyrtec (cetirizine): This is an excellent second-generation antihistamine that helps to reduce respiratory inflammatory response to environmental allergens.  In some patients, this medication can cause daytime sleepiness so I recommend that you take 1 tablet daily at bedtime.     Flonase (fluticasone): This is a steroid nasal spray that you use once daily, 1 spray in each nare.  This medication does not work well if you decide to use it only used as you feel you need to, it works best used on a daily basis.  After 3 to 5 days of use, you will notice significant reduction of the inflammation and mucus production that is currently being caused by exposure to allergens, whether seasonal or environmental.  The most common side effect of this medication is nosebleeds.  If you experience a nosebleed, please discontinue use for 1 week, then feel free to resume.  I have provided you with a prescription.     Atrovent (ipratropium): This is an excellent nasal decongestant spray I have added to your recommended nasal steroid  that will not cause rebound congestion, please instill 2 sprays into each nare with each use.  Because nasal steroids can take several days before they begin to provide full benefit, I recommend that you use this spray in addition to the nasal steroid prescribed for you.  Please use it after you have used your nasal steroid and repeat up to 4 times daily as needed.  I have provided you with a prescription for this medication.      ProAir, Ventolin, Proventil (albuterol): This inhaled medication contains a short acting beta agonist bronchodilator.  This medication works on the smooth muscle that  opens and constricts of your airways by relaxing the muscle.  The result of relaxation of the smooth muscle is increased air movement and improved work of breathing.  This is a short acting medication that can be used every 4-6 hours as needed for increased work of breathing, shortness of breath, wheezing and excessive coughing.  I have provided you with a prescription.   Promethazine DM: Promethazine is both a nasal decongestant and an antinausea medication that makes most patients feel fairly sleepy.  The DM is dextromethorphan, a cough suppressant found in many over-the-counter cough medications.  Please take 5 mL before bedtime to minimize your cough which will help you sleep better.  I have sent a prescription for this medication to your pharmacy.   Imodium (loperamide): This is an antidiarrheal medication.  Take this medication every time you have an episode of diarrhea.  Do not take this medication more than 4 times daily.   Please follow-up within the next 5-7 days either with your primary care provider or urgent care if your symptoms do not resolve.  If you do not have a primary care provider, we will assist you in finding one.   Thank you for visiting urgent care today.  We appreciate the opportunity to participate in your care.

## 2022-04-20 NOTE — ED Provider Notes (Signed)
[facility]    CSN: [id number] Arrival date & time: 04/20/22  1306    HISTORY   Chief Complaint  Patient presents with   Cough   HPI Rebecca Moss is a 51 y.o. female. Patient reports a history of seasonal allergies, not currently taking allergy medication but states she has been prescribed Flonase in the past which she felt did not help.  Patient states she consistently has congestion on the left side of her nose.  Patient states that for the past 2 days she has had significantly worsening cough along with a runny nose.  Patient states he has been using cough drops but has not had meaningful relief.  Patient denies a history of asthma, frequent respiratory tract infections.  Patient denies fever, aches, chills, nausea, vomiting, diarrhea.  Patient denies known sick contacts.  Patient denies history of cigarette smoking.  The history is provided by the patient.   Past Medical History:  Diagnosis Date   Uterine fibroid    Patient Active Problem List   Diagnosis Date Noted   Chronic left flank pain 06/17/2021   Other headache syndrome 06/17/2021   Malodorous urine 04/16/2021   Left-sided low back pain without sciatica 04/16/2021   Closed TBI (traumatic brain injury) ([place]) 01/03/2013   Past Surgical History:  Procedure Laterality Date   ABDOMINAL HYSTERECTOMY  10/22/2017   UTERINE FIBROID SURGERY     OB History   No obstetric history on file.    Home Medications    Prior to Admission medications   Medication Sig Start Date End Date Taking? Authorizing Provider  amoxicillin-clavulanate (AUGMENTIN) 875-125 MG tablet Take 1 tablet by mouth 2 (two) times daily for 10 days. For infection in your right ear 04/20/22 04/30/22 Yes Lynden Oxford Scales, PA-C  cetirizine (ZYRTEC ALLERGY) 10 MG tablet Take 1 tablet (10 mg total) by mouth at bedtime. For allergies 04/20/22 07/19/22 Yes Lynden Oxford Scales, PA-C  fluconazole (DIFLUCAN) 150 MG tablet Take 1 tablet today.  Take  second tablet 3 days later.  For vaginal yeast infection. 04/20/22  Yes Lynden Oxford Scales, PA-C  fluticasone (FLONASE) 50 MCG/ACT nasal spray Place 1 spray into both nostrils daily. Begin by using 2 sprays in each nare daily for 3 to 5 days, then decrease to 1 spray in each nare daily.  For allergies and congestion 04/20/22  Yes Lynden Oxford Scales, PA-C  ipratropium (ATROVENT) 0.06 % nasal spray Place 2 sprays into both nostrils 3 (three) times daily. As needed for nasal congestion, runny nose 04/20/22  Yes Lynden Oxford Scales, PA-C  loperamide (IMODIUM A-D) 2 MG tablet Take 2 tablets after having diarrhea.  May take up to 4 times daily, do not exceed 8 tablets/day 04/20/22  Yes Lynden Oxford Scales, PA-C  promethazine-dextromethorphan (PROMETHAZINE-DM) 6.25-15 MG/5ML syrup Take 5 mLs by mouth 4 (four) times daily as needed for cough. For cough 04/20/22  Yes Lynden Oxford Scales, PA-C   Family History History reviewed. No pertinent family history. Social History Social History   Tobacco Use   Smoking status: Never   Smokeless tobacco: Never  Substance Use Topics   Alcohol use: No   Drug use: No   Allergies   Patient has no known allergies.  Review of Systems Review of Systems Pertinent findings noted in history of present illness.   Physical Exam Triage Vital Signs ED Triage Vitals  Enc Vitals Group     BP 09/04/21 0827 (!) 147/82     Pulse Rate 09/04/21 0827  72     Resp 09/04/21 0827 18     Temp 09/04/21 0827 98.3 F (36.8 C)     Temp Source 09/04/21 0827 Oral     SpO2 09/04/21 0827 98 %     Weight --      Height --      Head Circumference --      Peak Flow --      Pain Score 09/04/21 0826 5     Pain Loc --      Pain Edu? --      Excl. in [place]? --   No data found.  Updated Vital Signs BP 116/68 (BP Location: Right Arm)   Pulse 92   Temp 99.7 F (37.6 C) (Oral)   Resp 16   LMP 07/07/2017   SpO2 94%   Physical Exam Vitals and nursing note reviewed.   Constitutional:      General: She is not in acute distress.    Appearance: Normal appearance. She is not ill-appearing.  HENT:     Head: Normocephalic and atraumatic.     Salivary Glands: Right salivary gland is not diffusely enlarged or tender. Left salivary gland is not diffusely enlarged or tender.     Right Ear: Ear canal and external ear normal. No drainage. No middle ear effusion. There is no impacted cerumen. Tympanic membrane is injected, erythematous and bulging (Suppurative fluid).     Left Ear: Ear canal and external ear normal. No drainage.  No middle ear effusion. There is no impacted cerumen. Tympanic membrane is erythematous. Tympanic membrane is not injected or bulging.     Ears:     Comments: Bilateral EACs normal, both TMs bulging with clear fluid    Nose: Rhinorrhea present. No nasal deformity, septal deviation, signs of injury, nasal tenderness, mucosal edema or congestion. Rhinorrhea is clear.     Right Nostril: Occlusion present. No foreign body, epistaxis or septal hematoma.     Left Nostril: Occlusion present. No foreign body, epistaxis or septal hematoma.     Right Turbinates: Enlarged, swollen and pale.     Left Turbinates: Enlarged, swollen and pale.     Right Sinus: No maxillary sinus tenderness or frontal sinus tenderness.     Left Sinus: No maxillary sinus tenderness or frontal sinus tenderness.     Mouth/Throat:     Lips: Pink. No lesions.     Mouth: Mucous membranes are moist. No oral lesions.     Pharynx: Oropharynx is clear. Uvula midline. No posterior oropharyngeal erythema or uvula swelling.     Tonsils: No tonsillar exudate. 0 on the right. 0 on the left.     Comments: Postnasal drip Eyes:     General: Lids are normal.        Right eye: No discharge.        Left eye: No discharge.     Extraocular Movements: Extraocular movements intact.     Conjunctiva/sclera: Conjunctivae normal.     Right eye: Right conjunctiva is not injected.     Left eye: Left  conjunctiva is not injected.  Neck:     Trachea: Trachea and phonation normal.  Cardiovascular:     Rate and Rhythm: Normal rate and regular rhythm.     Pulses: Normal pulses.     Heart sounds: Normal heart sounds. No murmur heard.    No friction rub. No gallop.  Pulmonary:     Effort: Pulmonary effort is normal. No tachypnea, bradypnea, accessory muscle usage, prolonged  expiration, respiratory distress or retractions.     Breath sounds: No stridor, decreased air movement or transmitted upper airway sounds. No decreased breath sounds, wheezing, rhonchi or rales.     Comments: Coarse breath sounds Chest:     Chest wall: No tenderness.  Musculoskeletal:        General: Normal range of motion.     Cervical back: Normal range of motion and neck supple. Normal range of motion.  Lymphadenopathy:     Cervical: No cervical adenopathy.  Skin:    General: Skin is warm and dry.     Findings: No erythema or rash.  Neurological:     General: No focal deficit present.     Mental Status: She is alert and oriented to person, place, and time.  Psychiatric:        Mood and Affect: Mood normal.        Behavior: Behavior normal.     Visual Acuity Right Eye Distance:   Left Eye Distance:   Bilateral Distance:    Right Eye Near:   Left Eye Near:    Bilateral Near:     UC Couse / Diagnostics / Procedures:    EKG  Radiology No results found.  Procedures Procedures (including critical care time)  UC Diagnoses / Final Clinical Impressions(s)   I have reviewed the triage vital signs and the nursing notes.  Pertinent labs & imaging results that were available during my care of the patient were reviewed by me and considered in my medical decision making (see chart for details).   Final diagnoses:  Acute suppurative otitis media of right ear  Acute bronchitis, unspecified organism  Perennial allergic rhinitis   Patient provided with a prescription for Augmentin for right otitis media.   Patient provided with Diflucan and Imodium for possible yeast infection and diarrhea.  Patient provided with Promethazine DM for nighttime cough.  Patient advised to begin Zyrtec, resume Flonase and add Atrovent nasal spray as needed for congestion and postnasal drip.  Return precautions advised.  ED Prescriptions     Medication Sig Dispense Auth. Provider   loperamide (IMODIUM A-D) 2 MG tablet Take 2 tablets after having diarrhea.  May take up to 4 times daily, do not exceed 8 tablets/day 30 tablet Lynden Oxford Scales, PA-C   amoxicillin-clavulanate (AUGMENTIN) 875-125 MG tablet Take 1 tablet by mouth 2 (two) times daily for 10 days. For infection in your right ear 20 tablet Lynden Oxford Scales, PA-C   fluticasone (FLONASE) 50 MCG/ACT nasal spray Place 1 spray into both nostrils daily. Begin by using 2 sprays in each nare daily for 3 to 5 days, then decrease to 1 spray in each nare daily.  For allergies and congestion 16 mL Lynden Oxford Scales, PA-C   cetirizine (ZYRTEC ALLERGY) 10 MG tablet Take 1 tablet (10 mg total) by mouth at bedtime. For allergies 30 tablet Lynden Oxford Scales, PA-C   ipratropium (ATROVENT) 0.06 % nasal spray Place 2 sprays into both nostrils 3 (three) times daily. As needed for nasal congestion, runny nose 15 mL Lynden Oxford Scales, PA-C   promethazine-dextromethorphan (PROMETHAZINE-DM) 6.25-15 MG/5ML syrup Take 5 mLs by mouth 4 (four) times daily as needed for cough. For cough 118 mL Lynden Oxford Scales, PA-C   fluconazole (DIFLUCAN) 150 MG tablet Take 1 tablet today.  Take second tablet 3 days later.  For vaginal yeast infection. 2 tablet Lynden Oxford Scales, PA-C      PDMP not reviewed this encounter.  Pending  results:  Labs Reviewed - No data to display  Medications Ordered in UC: Medications  albuterol (VENTOLIN HFA) 108 (90 Base) MCG/ACT inhaler 2 puff (2 puffs Inhalation Given 04/20/22 1429)    Disposition Upon Discharge:  Condition:  stable for discharge home Home: take medications as prescribed; routine discharge instructions as discussed; follow up as advised.  Patient presented with an acute illness with associated systemic symptoms and significant discomfort requiring urgent management. In my opinion, this is a condition that a prudent lay person (someone who possesses an average knowledge of health and medicine) may potentially expect to result in complications if not addressed urgently such as respiratory distress, impairment of bodily function or dysfunction of bodily organs.   Routine symptom specific, illness specific and/or disease specific instructions were discussed with the patient and/or caregiver at length.   As such, the patient has been evaluated and assessed, work-up was performed and treatment was provided in alignment with urgent care protocols and evidence based medicine.  Patient/parent/caregiver has been advised that the patient may require follow up for further testing and treatment if the symptoms continue in spite of treatment, as clinically indicated and appropriate.  If the patient was tested for COVID-19, Influenza and/or RSV, then the patient/parent/guardian was advised to isolate at home pending the results of his/her diagnostic coronavirus test and potentially longer if they're positive. I have also advised pt that if his/her COVID-19 test returns positive, it's recommended to self-isolate for at least 10 days after symptoms first appeared AND until fever-free for 24 hours without fever reducer AND other symptoms have improved or resolved. Discussed self-isolation recommendations as well as instructions for household member/close contacts as per the [facility] and [place] [facility], and also gave patient the [place] packet with this information.  Patient/parent/caregiver has been advised to return to the [facility] or PCP in 3-5 days if no better; to PCP or the Emergency Department if new signs and symptoms develop, or if the  current signs or symptoms continue to change or worsen for further workup, evaluation and treatment as clinically indicated and appropriate  The patient will follow up with their current PCP if and as advised. If the patient does not currently have a PCP we will assist them in obtaining one.   The patient may need specialty follow up if the symptoms continue, in spite of conservative treatment and management, for further workup, evaluation, consultation and treatment as clinically indicated and appropriate.  Patient/parent/caregiver verbalized understanding and agreement of plan as discussed.  All questions were addressed during visit.  Please see discharge instructions below for further details of plan.  Discharge Instructions:   Discharge Instructions      Consulte la lista a continuacin para conocer los medicamentos recomendados, las dosis y las frecuencias para Public house manager sus sntomas actuales:  Augmentin (amoxicilina - cido clavulnico): tome [place] 10 das para eliminar la infeccin en su odo derecho, puede tomarla con o sin alimentos. Este antibitico puede causar Higher education careers adviser, esto se resolver una vez que se completen los antibiticos. Puede usar un probitico, Armed forces operational officer, tomar Imodium mientras toma este medicamento. Evite otros medicamentos sistmicos como Maalox, Pepto-Bismol o leche de magnesia, ya que pueden interferir con la capacidad de su cuerpo para Medical sales representative antibiticos.   Es muy importante que tome los antibiticos segn lo prescrito. Si omite dosis o no completa el ciclo completo de antibiticos, se expone a un riesgo significativo de infeccin recurrente, que a menudo puede ser [employer]  que la infeccin inicial.   Diflucan (fluconazol): como puede o no saber, tomar antibiticos a menudo [employer] pacientes desarrollen una infeccin vaginal por hongos. Por este motivo, le he proporcionado una receta para Diflucan. Tome la primera  tableta de Diflucan el da 3 o 4 de su terapia con antibiticos y tome la segunda tableta de Diflucan 3 das despus. No es necesario que recoja esta receta ni que tome este medicamento a menos que presente sntomas de candidiasis vaginal, como flujo vaginal espeso y blanco y/o picazn vaginal. Esta receta se ha proporcionado como cortesa y para su comodidad.  Zyrtec (cetirizina): este es un excelente antihistamnico de segunda generacin que ayuda a reducir la respuesta inflamatoria respiratoria a los alrgenos [place]. En algunos pacientes, este medicamento puede causar somnolencia diurna, por lo que le recomiendo que tome 1 tableta al da antes de [place].   Flonase (fluticasona): este es un aerosol nasal con esteroides que se [place] una vez al da, 1 aerosol en cada orificio nasal. Este medicamento no funciona bien si decide usarlo solo cuando siente que lo necesita, funciona mejor si se [place] a diario. Despus de 3 a 5 das de [place], notar una reduccin significativa de la inflamacin y Engineer, materials produccin de mucosidad que actualmente est provocando la exposicin a los alrgenos, ya sean estacionales o ambientales. El efecto secundario ms comn de este medicamento es la hemorragia nasal. Si experimenta una hemorragia nasal, suspenda el uso durante 1 [place], luego sintase libre de Optician, dispensing. Le he proporcionado una receta.   Atrovent (ipratropio): este es un excelente aerosol descongestionante nasal que he agregado a su esteroide nasal recomendado que no causar una congestin de rebote, inculque 2 aerosoles en cada orificio nasal con cada uso. Debido a que los esteroides nasales pueden demorar varios das antes de que comiencen a brindar el beneficio completo, le recomiendo que use este aerosol adems del esteroide nasal recetado para usted. selo despus de haber usado su esteroide nasal y repita hasta 4 veces al da segn sea necesario. Le he proporcionado una receta para [employer].   ProAir,  Ventolin, Proventil (albuterol): este medicamento inhalado contiene un broncodilatador beta agonista de accin corta. Este medicamento acta sobre el msculo liso que abre y Education officer, environmental las vas respiratorias al relajar el msculo. El resultado de la relajacin del msculo liso es un mayor movimiento del aire y un mejor trabajo respiratorio. Este es un medicamento de accin corta que se puede usar cada 4 a 6 horas segn sea necesario para el aumento del trabajo respiratorio, dificultad para Ambulance person, sibilancias y tos excesiva. Le he proporcionado una receta.  Prometazina DM: la prometazina es tanto un descongestionante nasal como un medicamento contra las nuseas que hace que la mayora de los pacientes se sientan bastante somnolientos. El DM es dextrometorfano, un supresor de la tos que se encuentra en muchos medicamentos para la tos de [place]. Tome 5 ml antes de acostarse para minimizar la tos, lo que lo ayudar a Advice worker. He enviado una receta para este medicamento a su farmacia.   Imodium (loperamida): Este es un medicamento antidiarreico. Tome este medicamento cada vez que tenga un episodio de [place]. No tome este medicamento ms de 4 veces al da.   Haga un seguimiento dentro de los prximos 5 a 7 das, ya sea con su proveedor de atencin primaria o con atencin de urgencia si sus sntomas no se resuelven. Si no tiene un proveedor de [employer], lo ayudaremos a Child psychotherapist.  Gracias por visitar la atencin de urgencia hoy. Agradecemos la oportunidad de Economist.   Please see the list below for recommended medications, dosages and frequencies to provide relief of your current symptoms:    Augmentin (amoxicillin - clavulanic acid):  take 1 tablet twice daily for 10 days to get rid of the infection in your right ear, you can take it with or without food.  This antibiotic can cause upset stomach, this will resolve once antibiotics are complete.  You are welcome to  use a probiotic, eat yogurt, take Imodium while taking this medication.  Please avoid other systemic medications such as Maalox, Pepto-Bismol or milk of magnesia as they can interfere with your body's ability to absorb the antibiotics.   It is very important that you take antibiotics as prescribed.  If you skip doses or do not complete the full course of antibiotics, you put yourself at significant risk of recurrent infection which can often be worse than your initial infection.   Diflucan (fluconazole): As you may or may not be aware, taking antibiotics can often cause patients to develop a vaginal yeast infection.  For this reason, I have provided you with a prescription for Diflucan.  Please take the first Diflucan tablet on day 3 or 4 of your antibiotic therapy, and take the second Diflucan tablet 3 days later.  You do not need to pick up this prescription or take this medication unless you develop symptoms of vaginal yeast infection including thick, white vaginal discharge and/or vaginal itching.  This prescription has been provided as a Manufacturing engineer and for your convenience.  Zyrtec (cetirizine): This is an excellent second-generation antihistamine that helps to reduce respiratory inflammatory response to environmental allergens.  In some patients, this medication can cause daytime sleepiness so I recommend that you take 1 tablet daily at bedtime.     Flonase (fluticasone): This is a steroid nasal spray that you use once daily, 1 spray in each nare.  This medication does not work well if you decide to use it only used as you feel you need to, it works best used on a daily basis.  After 3 to 5 days of use, you will notice significant reduction of the inflammation and mucus production that is currently being caused by exposure to allergens, whether seasonal or environmental.  The most common side effect of this medication is nosebleeds.  If you experience a nosebleed, please discontinue use for 1 week, then  feel free to resume.  I have provided you with a prescription.     Atrovent (ipratropium): This is an excellent nasal decongestant spray I have added to your recommended nasal steroid that will not cause rebound congestion, please instill 2 sprays into each nare with each use.  Because nasal steroids can take several days before they begin to provide full benefit, I recommend that you use this spray in addition to the nasal steroid prescribed for you.  Please use it after you have used your nasal steroid and repeat up to 4 times daily as needed.  I have provided you with a prescription for this medication.      ProAir, Ventolin, Proventil (albuterol): This inhaled medication contains a short acting beta agonist bronchodilator.  This medication works on the smooth muscle that opens and constricts of your airways by relaxing the muscle.  The result of relaxation of the smooth muscle is increased air movement and improved work of breathing.  This is a short acting medication that  can be used every 4-6 hours as needed for increased work of breathing, shortness of breath, wheezing and excessive coughing.  I have provided you with a prescription.   Promethazine DM: Promethazine is both a nasal decongestant and an antinausea medication that makes most patients feel fairly sleepy.  The DM is dextromethorphan, a cough suppressant found in many over-the-counter cough medications.  Please take 5 mL before bedtime to minimize your cough which will help you sleep better.  I have sent a prescription for this medication to your pharmacy.   Imodium (loperamide): This is an antidiarrheal medication.  Take this medication every time you have an episode of diarrhea.  Do not take this medication more than 4 times daily.   Please follow-up within the next 5-7 days either with your primary care provider or urgent care if your symptoms do not resolve.  If you do not have a primary care provider, we will assist you in finding  one.   Thank you for visiting urgent care today.  We appreciate the opportunity to participate in your care.     This office note has been dictated using Museum/gallery curator.  Unfortunately, and despite my best efforts, this method of dictation can sometimes lead to occasional typographical or grammatical errors.  I apologize in advance if this occurs.     Lynden Oxford Scales, PA-C 04/20/22 1605

## 2022-04-20 NOTE — ED Triage Notes (Signed)
Pt states cough and runny nose for the past 2 days. Has been taking cough drops with some relief.

## 2022-09-05 ENCOUNTER — Encounter: Payer: Self-pay | Admitting: *Deleted

## 2022-09-05 ENCOUNTER — Ambulatory Visit
Admission: EM | Admit: 2022-09-05 | Discharge: 2022-09-05 | Disposition: A | Discharge status: Home / Self Care | Payer: Self-pay | Attending: Urgent Care | Admitting: Urgent Care

## 2022-09-05 DIAGNOSIS — N3001 Acute cystitis with hematuria: Secondary | ICD-10-CM | POA: Insufficient documentation

## 2022-09-05 DIAGNOSIS — R300 Dysuria: Secondary | ICD-10-CM | POA: Insufficient documentation

## 2022-09-05 LAB — POCT URINALYSIS DIP (MANUAL ENTRY)
Bilirubin, UA: NEGATIVE
Glucose, UA: 100 mg/dL — AB
Ketones, POC UA: NEGATIVE mg/dL
Nitrite, UA: NEGATIVE
Protein Ur, POC: >=300 mg/dL — AB
Spec Grav, UA: >=1.03 — AB (ref 1.010–1.025)
Urobilinogen, UA: 0.2 E.U./dL
pH, UA: 6 (ref 5.0–8.0)

## 2022-09-05 MED ORDER — CEPHALEXIN 500 MG PO CAPS: 500.0000 mg | ORAL_CAPSULE | Freq: Two times a day (BID) | ORAL | 0 refills | Status: DC

## 2022-09-05 NOTE — ED Triage Notes (Signed)
C/O dysuria x 8 days. Denies fevers. C/O low back pain.

## 2022-09-05 NOTE — ED Provider Notes (Signed)
[facility]  Note:  This document was prepared using Systems analyst and may include unintentional dictation errors.  MRN: [id number] DOB: Jan 22, 1971  Subjective:   Rebecca Moss is a 51 y.o. female presenting for 8 day history of acute onset dysuria, urinary frequency, intermittent hematuria, urinary urgency. Denies fever, n/v, abdominal pain, pelvic pain, rashes, vaginal discharge. Has ~16-20 ounces of coffee daily. Tries to hydrate very well. Has had an occasional UTI, believes last episode was about 7-8 years ago.    No current facility-administered medications for this encounter.  Current Outpatient Medications:    Phenazopyridine HCl (AZO TABS PO), Take by mouth., Disp: , Rfl:    cetirizine (ZYRTEC ALLERGY) 10 MG tablet, Take 1 tablet (10 mg total) by mouth at bedtime. For allergies, Disp: 30 tablet, Rfl: 2   fluconazole (DIFLUCAN) 150 MG tablet, Take 1 tablet today.  Take second tablet 3 days later.  For vaginal yeast infection., Disp: 2 tablet, Rfl: 0   fluticasone (FLONASE) 50 MCG/ACT nasal spray, Place 1 spray into both nostrils daily. Begin by using 2 sprays in each nare daily for 3 to 5 days, then decrease to 1 spray in each nare daily.  For allergies and congestion, Disp: 16 mL, Rfl: 1   ipratropium (ATROVENT) 0.06 % nasal spray, Place 2 sprays into both nostrils 3 (three) times daily. As needed for nasal congestion, runny nose, Disp: 15 mL, Rfl: 1   loperamide (IMODIUM A-D) 2 MG tablet, Take 2 tablets after having diarrhea.  May take up to 4 times daily, do not exceed 8 tablets/day, Disp: 30 tablet, Rfl: 0   promethazine-dextromethorphan (PROMETHAZINE-DM) 6.25-15 MG/5ML syrup, Take 5 mLs by mouth 4 (four) times daily as needed for cough. For cough, Disp: 118 mL, Rfl: 0   No Known Allergies  Past Medical History:  Diagnosis Date   Uterine fibroid      Past Surgical History:  Procedure Laterality Date   ABDOMINAL HYSTERECTOMY   10/22/2017   UTERINE FIBROID SURGERY      History reviewed. No pertinent family history.  Social History   Tobacco Use   Smoking status: Never   Smokeless tobacco: Never  Vaping Use   Vaping Use: Never used  Substance Use Topics   Alcohol use: No   Drug use: No    ROS   Objective:   Vitals: BP 128/87   Pulse 78   Temp 97.9 F (36.6 C) (Oral)   Resp 16   LMP 07/07/2017   SpO2 96%   Physical Exam Constitutional:      General: She is not in acute distress.    Appearance: Normal appearance. She is well-developed. She is not ill-appearing, toxic-appearing or diaphoretic.  HENT:     Head: Normocephalic and atraumatic.     Nose: Nose normal.     Mouth/Throat:     Mouth: Mucous membranes are moist.  Eyes:     General: No scleral icterus.       Right eye: No discharge.        Left eye: No discharge.     Extraocular Movements: Extraocular movements intact.     Conjunctiva/sclera: Conjunctivae normal.  Cardiovascular:     Rate and Rhythm: Normal rate.  Pulmonary:     Effort: Pulmonary effort is normal.  Abdominal:     General: Bowel sounds are normal. There is no distension.     Palpations: Abdomen is soft. There is no mass.     Tenderness:  There is no abdominal tenderness. There is no right CVA tenderness, left CVA tenderness, guarding or rebound.  Skin:    General: Skin is warm and dry.  Neurological:     General: No focal deficit present.     Mental Status: She is alert and oriented to person, place, and time.  Psychiatric:        Mood and Affect: Mood normal.        Behavior: Behavior normal.        Thought Content: Thought content normal.        Judgment: Judgment normal.    Results for orders placed or performed during the hospital encounter of 09/05/22 (from the past 24 hour(s))  POCT urinalysis dipstick     Status: Abnormal   Collection Time: 09/05/22  8:32 AM  Result Value Ref Range   Color, UA yellow yellow   Clarity, UA cloudy (A) clear    Glucose, UA =100 (A) negative mg/dL   Bilirubin, UA negative negative   Ketones, POC UA negative negative mg/dL   Spec Grav, UA >=1.030 (A) 1.010 - 1.025   Blood, UA large (A) negative   pH, UA 6.0 5.0 - 8.0   Protein Ur, POC >=300 (A) negative mg/dL   Urobilinogen, UA 0.2 0.2 or 1.0 E.U./dL   Nitrite, UA Negative Negative   Leukocytes, UA Small (1+) (A) Negative    Assessment and Plan :   PDMP not reviewed this encounter.  1. Acute cystitis with hematuria   2. Dysuria     Start Keflex to cover for acute cystitis, urine culture pending.  Recommended aggressive hydration, limiting urinary irritants. Counseled patient on potential for adverse effects with medications prescribed/recommended today, ER and return-to-clinic precautions discussed, patient verbalized understanding.    Jaynee Eagles, [place] 09/05/22 [phone]

## 2022-09-05 NOTE — Discharge Instructions (Signed)

## 2022-09-07 LAB — URINE CULTURE: Culture: >=100000 — AB

## 2023-02-13 ENCOUNTER — Encounter: Payer: Self-pay | Admitting: *Deleted

## 2023-02-13 ENCOUNTER — Other Ambulatory Visit: Payer: Self-pay

## 2023-02-13 ENCOUNTER — Ambulatory Visit
Admission: EM | Admit: 2023-02-13 | Discharge: 2023-02-13 | Disposition: A | Discharge status: Home / Self Care | Payer: Self-pay | Attending: Nurse Practitioner | Admitting: Nurse Practitioner

## 2023-02-13 DIAGNOSIS — J069 Acute upper respiratory infection, unspecified: Secondary | ICD-10-CM

## 2023-02-13 MED ORDER — BENZONATATE 200 MG PO CAPS: 200.0000 mg | ORAL_CAPSULE | Freq: Three times a day (TID) | ORAL | 0 refills | Status: AC | PRN

## 2023-02-13 MED ORDER — IPRATROPIUM BROMIDE 0.03 % NA SOLN: 2.0000 | Freq: Two times a day (BID) | NASAL | 0 refills | Status: AC

## 2023-02-13 NOTE — ED Triage Notes (Signed)
Via [employer] video interpreter [phone]: Pt c/o cough, runny nose & nasal congestion onset 3 days ago. No known fevers. Reports taking IBU - last dose @ 7am.

## 2023-02-13 NOTE — ED Provider Notes (Signed)
[facility]    CSN: [id number] Arrival date & time: 02/13/23  1431      History   Chief Complaint Chief Complaint  Patient presents with   Cough   Nasal Congestion    HPI Rebecca Moss is a 52 y.o. female  presents for evaluation of URI symptoms for 3 days.  Patient speaks Spanish so interpretation line was used.  Patient reports associated symptoms of cough, congestion. Denies N/V/D, sore throat, fevers, chills, body aches, ear pain, shortness of breath. Patient does not have a hx of asthma or smoking. No known sick contacts.  Pt has taken ibuprofen OTC for symptoms. Pt has no other concerns at this time.    Cough   Past Medical History:  Diagnosis Date   Uterine fibroid     Patient Active Problem List   Diagnosis Date Noted   Chronic left flank pain 06/17/2021   Other headache syndrome 06/17/2021   Malodorous urine 04/16/2021   Left-sided low back pain without sciatica 04/16/2021   Closed TBI (traumatic brain injury) 01/03/2013    Past Surgical History:  Procedure Laterality Date   ABDOMINAL HYSTERECTOMY  10/22/2017   UTERINE FIBROID SURGERY      OB History   No obstetric history on file.      Home Medications    Prior to Admission medications   Medication Sig Start Date End Date Taking? Authorizing Provider  benzonatate (TESSALON) 200 MG capsule Take 1 capsule (200 mg total) by mouth 3 (three) times daily as needed for cough. 02/13/23  Yes Radford Pax, NP  ipratropium (ATROVENT) 0.03 % nasal spray Place 2 sprays into both nostrils every 12 (twelve) hours. 02/13/23  Yes Radford Pax, NP  cephALEXin (KEFLEX) 500 MG capsule Take 1 capsule (500 mg total) by mouth 2 (two) times daily. 09/05/22   Wallis Bamberg, PA-C  cetirizine (ZYRTEC ALLERGY) 10 MG tablet Take 1 tablet (10 mg total) by mouth at bedtime. For allergies 04/20/22 07/19/22  Theadora Rama Scales, PA-C  Phenazopyridine HCl (AZO TABS PO) Take by mouth.    [provider]    Family  History History reviewed. No pertinent family history.  Social History Social History   Tobacco Use   Smoking status: Never   Smokeless tobacco: Never  Vaping Use   Vaping Use: Never used  Substance Use Topics   Alcohol use: No   Drug use: No     Allergies   Patient has no known allergies.   Review of Systems Review of Systems  HENT:  Positive for congestion.   Respiratory:  Positive for cough.      Physical Exam Triage Vital Signs ED Triage Vitals  Enc Vitals Group     BP 02/13/23 1526 127/74     Pulse Rate 02/13/23 1526 74     Resp 02/13/23 1526 16     Temp 02/13/23 1526 98.8 F (37.1 C)     Temp Source 02/13/23 1526 Oral     SpO2 02/13/23 1526 94 %     Weight --      Height --      Head Circumference --      Peak Flow --      Pain Score 02/13/23 1527 5     Pain Loc --      Pain Edu? --      Excl. in GC? --    No data found.  Updated Vital Signs BP 127/74   Pulse 74  Temp 98.8 F (37.1 C) (Oral)   Resp 16   LMP 07/07/2017   SpO2 94%   Visual Acuity Right Eye Distance:   Left Eye Distance:   Bilateral Distance:    Right Eye Near:   Left Eye Near:    Bilateral Near:     Physical Exam Vitals and nursing note reviewed.  Constitutional:      General: She is not in acute distress.    Appearance: She is well-developed. She is not ill-appearing.  HENT:     Head: Normocephalic and atraumatic.     Right Ear: Tympanic membrane and ear canal normal.     Left Ear: Tympanic membrane and ear canal normal.     Nose: Congestion present.     Mouth/Throat:     Mouth: Mucous membranes are moist.     Pharynx: Oropharynx is clear. Uvula midline. No oropharyngeal exudate or posterior oropharyngeal erythema.     Tonsils: No tonsillar exudate or tonsillar abscesses.  Eyes:     Conjunctiva/sclera: Conjunctivae normal.     Pupils: Pupils are equal, round, and reactive to light.  Cardiovascular:     Rate and Rhythm: Normal rate and regular rhythm.      Heart sounds: Normal heart sounds.  Pulmonary:     Effort: Pulmonary effort is normal.     Breath sounds: Normal breath sounds.  Musculoskeletal:     Cervical back: Normal range of motion and neck supple.  Lymphadenopathy:     Cervical: No cervical adenopathy.  Skin:    General: Skin is warm and dry.  Neurological:     General: No focal deficit present.     Mental Status: She is alert and oriented to person, place, and time.  Psychiatric:        Mood and Affect: Mood normal.        Behavior: Behavior normal.      UC Treatments / Results  Labs (all labs ordered are listed, but only abnormal results are displayed) Labs Reviewed - No data to display  EKG   Radiology No results found.  Procedures Procedures (including critical care time)  Medications Ordered in UC Medications - No data to display  Initial Impression / Assessment and Plan / UC Course  I have reviewed the triage vital signs and the nursing notes.  Pertinent labs & imaging results that were available during my care of the patient were reviewed by me and considered in my medical decision making (see chart for details).     Reviewed exam and symptoms with patient.  No red flags. Patient declined COVID testing Discussed viral upper respiratory illness and symptomatic treatment Tessalon as needed cough Atrovent nasal spray as needed PCP follow-up if symptoms do not improve Strict ER precautions reviewed and patient verbalized understanding Final Clinical Impressions(s) / UC Diagnoses   Final diagnoses:  Viral upper respiratory illness     Discharge Instructions      Your symptoms and exam are consistent for a viral illness. Please treat your symptoms with over the counter tylenol or ibuprofen, humidifier, and rest.  Tessalon as needed for cough.  Nasal spray as needed for nasal congestion.  Viral illnesses can last 7-14 days. Please follow up with your PCP if your symptoms are not improving. Please  go to the ER for any worsening symptoms. This includes but is not limited to fever you can not control with tylenol or ibuprofen, you are not able to stay hydrated, you have shortness of breath  or chest pain.  Thank you for choosing [facility] for your healthcare needs. I hope you feel better soon!     ED Prescriptions     Medication Sig Dispense Auth. Provider   benzonatate (TESSALON) 200 MG capsule Take 1 capsule (200 mg total) by mouth 3 (three) times daily as needed for cough. 20 capsule Radford Pax, NP   ipratropium (ATROVENT) 0.03 % nasal spray Place 2 sprays into both nostrils every 12 (twelve) hours. 30 mL Radford Pax, NP      PDMP not reviewed this encounter.   Radford Pax, NP 02/13/23 [phone]

## 2023-02-13 NOTE — Discharge Instructions (Addendum)
Your symptoms and exam are consistent for a viral illness. Please treat your symptoms with over the counter tylenol or ibuprofen, humidifier, and rest.  Tessalon as needed for cough.  Nasal spray as needed for nasal congestion.  Viral illnesses can last 7-14 days. Please follow up with your PCP if your symptoms are not improving. Please go to the ER for any worsening symptoms. This includes but is not limited to fever you can not control with tylenol or ibuprofen, you are not able to stay hydrated, you have shortness of breath or chest pain.  Thank you for choosing [facility] for your healthcare needs. I hope you feel better soon!

## 2023-12-09 ENCOUNTER — Ambulatory Visit
Admission: EM | Admit: 2023-12-09 | Discharge: 2023-12-09 | Disposition: A | Discharge status: Home / Self Care | Payer: Self-pay | Attending: Family Medicine | Admitting: Family Medicine

## 2023-12-09 DIAGNOSIS — Z23 Encounter for immunization: Secondary | ICD-10-CM

## 2023-12-09 DIAGNOSIS — M79642 Pain in left hand: Secondary | ICD-10-CM

## 2023-12-09 DIAGNOSIS — S61412A Laceration without foreign body of left hand, initial encounter: Secondary | ICD-10-CM

## 2023-12-09 MED ORDER — TETANUS-DIPHTH-ACELL PERTUSSIS 5-2.5-18.5 LF-MCG/0.5 IM SUSY
0.5000 mL | PREFILLED_SYRINGE | Freq: Once | INTRAMUSCULAR | Status: AC
  Administered 2023-12-09: 0.5 mL via INTRAMUSCULAR

## 2023-12-09 MED ORDER — IBUPROFEN 600 MG PO TABS: 600.0000 mg | ORAL_TABLET | Freq: Four times a day (QID) | ORAL | 0 refills | Status: AC | PRN

## 2023-12-09 MED ORDER — CEPHALEXIN 500 MG PO CAPS: 500.0000 mg | ORAL_CAPSULE | Freq: Three times a day (TID) | ORAL | 0 refills | Status: AC

## 2023-12-09 NOTE — Discharge Instructions (Signed)
CU[facility] HERID[facility] Regrese en 10 das para que le quiten los puntos/grapas o antes si tiene dudas.  Mantenga el rea limpia y seca durante 24 horas. No retire el vendaje, si lo aplica.  Despus de 24 horas, retire el vendaje y lave la herida suavemente con un jabn suave y agua tibia. Vuelva a aplicar un vendaje nuevo despus de limpiar la herida, si as lo indica.  Contine la limpieza diaria con agua y jabn hasta que se retiren los puntos/grapas.  No aplique ningn ungento o crema a la herida [employer] puntos/grapas estn colocados, ya que esto puede retrasar la curacin.  Notifique al consultorio si experimenta alguno de los siguientes signos de infeccin: hinchazn, enrojecimiento, drenaje de pus, rayas, fiebre >101.0 F  Notifique al consultorio si experimenta sangrado excesivo que no se detiene despus de 15 a 20 minutos de presin firme y Risk analyst.

## 2023-12-09 NOTE — ED Provider Notes (Signed)
[facility]  Note:  This document was prepared using Conservation officer, historic buildings and may include unintentional dictation errors.  MRN: [id number] DOB: 03-03-1971  Subjective:   Rebecca Moss is a 53 y.o. female presenting for suffering a left hand laceration using a knife at home today.  She applied a glue to the wound.  Cannot recall her last Tdap.  Came straight to our clinic thereafter.   Current Facility-Administered Medications:    Tdap (BOOSTRIX) injection 0.5 mL, 0.5 mL, Intramuscular, Once, Wallis Bamberg, PA-C  Current Outpatient Medications:    benzonatate (TESSALON) 200 MG capsule, Take 1 capsule (200 mg total) by mouth 3 (three) times daily as needed for cough., Disp: 20 capsule, Rfl: 0   cephALEXin (KEFLEX) 500 MG capsule, Take 1 capsule (500 mg total) by mouth 2 (two) times daily., Disp: 10 capsule, Rfl: 0   cetirizine (ZYRTEC ALLERGY) 10 MG tablet, Take 1 tablet (10 mg total) by mouth at bedtime. For allergies, Disp: 30 tablet, Rfl: 2   ipratropium (ATROVENT) 0.03 % nasal spray, Place 2 sprays into both nostrils every 12 (twelve) hours., Disp: 30 mL, Rfl: 0   Phenazopyridine HCl (AZO TABS PO), Take by mouth., Disp: , Rfl:    No Known Allergies  Past Medical History:  Diagnosis Date   Uterine fibroid      Past Surgical History:  Procedure Laterality Date   ABDOMINAL HYSTERECTOMY  10/22/2017   UTERINE FIBROID SURGERY      History reviewed. No pertinent family history.  Social History   Tobacco Use   Smoking status: Never   Smokeless tobacco: Never  Vaping Use   Vaping status: Never Used  Substance Use Topics   Alcohol use: No   Drug use: No    ROS   Objective:   Vitals: BP 100/64 (BP Location: Left Arm)   Pulse 68   Temp 98.8 F (37.1 C) (Oral)   Resp 16   LMP 07/07/2017   SpO2 96%   Physical Exam Constitutional:      General: She is not in acute distress.    Appearance: Normal appearance. She is well-developed.  She is not ill-appearing, toxic-appearing or diaphoretic.  HENT:     Head: Normocephalic and atraumatic.     Nose: Nose normal.     Mouth/Throat:     Mouth: Mucous membranes are moist.  Eyes:     General: No scleral icterus.       Right eye: No discharge.        Left eye: No discharge.     Extraocular Movements: Extraocular movements intact.  Cardiovascular:     Rate and Rhythm: Normal rate.  Pulmonary:     Effort: Pulmonary effort is normal.  Musculoskeletal:       Hands:  Skin:    General: Skin is warm and dry.  Neurological:     General: No focal deficit present.     Mental Status: She is alert and oriented to person, place, and time.  Psychiatric:        Mood and Affect: Mood normal.        Behavior: Behavior normal.    PROCEDURE NOTE: laceration repair Verbal consent obtained from patient.  Local anesthesia with 10cc Lidocaine 2% with epinephrine.  Wound explored for tendon, ligament damage. Wound scrubbed with soap and water and rinsed. Wound closed with #3 4-0 Ethilon (2 horizontal mattress and 1 simple interrupted) sutures.  Wound cleansed and dressed.   Assessment and  Plan :   PDMP not reviewed this encounter.  1. Left hand pain   2. Laceration of left hand without foreign body, initial encounter   3. Need for Tdap vaccination    Tdap updated in clinic.  Laceration repaired successfully. Wound care reviewed. Recommended Tylenol and/or ibuprofen for pain control. Return-to-clinic precautions discussed, patient verbalized understanding. Otherwise, follow up in 10 days for suture removal. Counseled patient on potential for adverse effects with medications prescribed/recommended today, ER and return-to-clinic precautions discussed, patient verbalized understanding.    Wallis Bamberg, [place] 12/09/23 [id number]

## 2023-12-09 NOTE — ED Triage Notes (Signed)
Pt reports laceration in the left hand with a knife today at home. Pt can't remember last Tdap.

## 2023-12-18 ENCOUNTER — Ambulatory Visit
Admission: EM | Admit: 2023-12-18 | Discharge: 2023-12-18 | Disposition: A | Discharge status: Home / Self Care | Payer: Self-pay | Attending: Family Medicine | Admitting: Family Medicine

## 2023-12-18 DIAGNOSIS — Z4802 Encounter for removal of sutures: Secondary | ICD-10-CM

## 2023-12-18 DIAGNOSIS — T3695XA Adverse effect of unspecified systemic antibiotic, initial encounter: Secondary | ICD-10-CM

## 2023-12-18 DIAGNOSIS — B379 Candidiasis, unspecified: Secondary | ICD-10-CM

## 2023-12-18 MED ORDER — FLUCONAZOLE 150 MG PO TABS: 150.0000 mg | ORAL_TABLET | Freq: Every day | ORAL | 0 refills | Status: AC

## 2023-12-18 NOTE — ED Triage Notes (Addendum)
 Pt presents for suture removal, denies pain. Pt states she had a reaction to the abx she was taking. C/o vaginal irriation

## 2023-12-18 NOTE — Discharge Instructions (Signed)
 Keep the wound clean and dry and watch for any signs of infection which include but are not limited to redness, swelling, drainage, warmth.  If these occur please seek reevaluation.  The clinical contact you with results of the vaginal swab done today if positive.  Take Diflucan  for 1 dose which will help to treat the likely antibiotic induced yeast infection that you are having.  Follow-up with your PCP as you need to.  Please go to the ER if you develop any worsening symptoms.  Hope you feel better soon!

## 2023-12-18 NOTE — ED Provider Notes (Signed)
 [facility]    CSN: [id number] Arrival date & time: 12/18/23  1304      History   Chief Complaint Chief Complaint  Patient presents with   Suture / Staple Removal    HPI Rebecca Moss is a 53 y.o. female presents for suture removal as well as vaginal issues.  Patient was seen in urgent care on 1/31 for a laceration to her left hand.  This was closed with 2 horizontal mattresses and a simple interrupted suture.  She was started on Keflex  3 times daily for 7 days.  States she only got through about 4 days of it as she began to have vaginal itching.  Denies any dysuria, fevers, redness, drainage or swelling from the incision site.  States it stayed close and is not causing her any discomfort.  Her tetanus was also updated at her visit.  No other concerns at this time.   Suture / Staple Removal    Past Medical History:  Diagnosis Date   Uterine fibroid     Patient Active Problem List   Diagnosis Date Noted   Chronic left flank pain 06/17/2021   Other headache syndrome 06/17/2021   Malodorous urine 04/16/2021   Left-sided low back pain without sciatica 04/16/2021   Closed TBI (traumatic brain injury) (HCC) 01/03/2013    Past Surgical History:  Procedure Laterality Date   ABDOMINAL HYSTERECTOMY  10/22/2017   UTERINE FIBROID SURGERY      OB History   No obstetric history on file.      Home Medications    Prior to Admission medications   Medication Sig Start Date End Date Taking? Authorizing Provider  fluconazole  (DIFLUCAN ) 150 MG tablet Take 1 tablet (150 mg total) by mouth daily. 12/18/23  Yes Raeford Brandenburg, Jodi R, NP  benzonatate  (TESSALON ) 200 MG capsule Take 1 capsule (200 mg total) by mouth 3 (three) times daily as needed for cough. 02/13/23   Libero Puthoff, Jodi R, NP  cephALEXin  (KEFLEX ) 500 MG capsule Take 1 capsule (500 mg total) by mouth 3 (three) times daily. 12/09/23   Christopher Savannah, PA-C  cetirizine  (ZYRTEC  ALLERGY) 10 MG tablet Take 1 tablet (10 mg total) by mouth at  bedtime. For allergies 04/20/22 07/19/22  Joesph Shaver Scales, PA-C  ibuprofen  (ADVIL ) 600 MG tablet Take 1 tablet (600 mg total) by mouth every 6 (six) hours as needed. 12/09/23   Christopher Savannah, PA-C  ipratropium (ATROVENT ) 0.03 % nasal spray Place 2 sprays into both nostrils every 12 (twelve) hours. 02/13/23   Mellisa Arshad, Jodi R, NP  Phenazopyridine HCl (AZO TABS PO) Take by mouth.    [provider]    Family History History reviewed. No pertinent family history.  Social History Social History   Tobacco Use   Smoking status: Never   Smokeless tobacco: Never  Vaping Use   Vaping status: Never Used  Substance Use Topics   Alcohol use: No   Drug use: No     Allergies   Patient has no known allergies.   Review of Systems Review of Systems  Genitourinary:  Positive for vaginal discharge.  Skin:        Suture removal     Physical Exam Triage Vital Signs ED Triage Vitals  Encounter Vitals Group     BP 12/18/23 1417 103/66     Systolic BP Percentile --      Diastolic BP Percentile --      Pulse Rate 12/18/23 1417 72     Resp 12/18/23  1417 17     Temp 12/18/23 1417 98.8 F (37.1 C)     Temp Source 12/18/23 1417 Oral     SpO2 12/18/23 1417 95 %     Weight --      Height --      Head Circumference --      Peak Flow --      Pain Score 12/18/23 1416 0     Pain Loc --      Pain Education --      Exclude from Growth Chart --    No data found.  Updated Vital Signs BP 103/66 (BP Location: Right Arm)   Pulse 72   Temp 98.8 F (37.1 C) (Oral)   Resp 17   LMP 07/07/2017   SpO2 95%   Visual Acuity Right Eye Distance:   Left Eye Distance:   Bilateral Distance:    Right Eye Near:   Left Eye Near:    Bilateral Near:     Physical Exam Vitals and nursing note reviewed.  Constitutional:      Appearance: Normal appearance.  HENT:     Head: Normocephalic and atraumatic.  Eyes:     Pupils: Pupils are equal, round, and reactive to light.  Cardiovascular:      Rate and Rhythm: Normal rate.  Pulmonary:     Effort: Pulmonary effort is normal.  Abdominal:     Tenderness: There is no right CVA tenderness or left CVA tenderness.  Musculoskeletal:       Hands:     Comments: There is a well-approximated laceration 2.5 cm laceration to the left palm.  No erythema, warmth, drainage or erythema.  2 horizontal mattresses and 1 interrupted suture noted  Skin:    General: Skin is warm and dry.  Neurological:     General: No focal deficit present.     Mental Status: She is alert and oriented to person, place, and time.  Psychiatric:        Mood and Affect: Mood normal.        Behavior: Behavior normal.      UC Treatments / Results  Labs (all labs ordered are listed, but only abnormal results are displayed) Labs Reviewed  CERVICOVAGINAL ANCILLARY ONLY    EKG   Radiology No results found.  Procedures Procedures (including critical care time)  Medications Ordered in UC Medications - No data to display  Initial Impression / Assessment and Plan / UC Course  I have reviewed the triage vital signs and the nursing notes.  Pertinent labs & imaging results that were available during my care of the patient were reviewed by me and considered in my medical decision making (see chart for details).     Vaginal swab is ordered and will contact for any positive results.  Will treat for likely an antibiotic induced yeast infection with Diflucan .  Sutures removed without incident and wound remains well-approximated.  Suture removal wound care reviewed as well as signs and symptoms of infection or need for reevaluation.  PCP follow-up as needed.  ER precautions reviewed. Final Clinical Impressions(s) / UC Diagnoses   Final diagnoses:  Encounter for removal of sutures  Antibiotic-induced yeast infection     Discharge Instructions      Keep the wound clean and dry and watch for any signs of infection which include but are not limited to redness,  swelling, drainage, warmth.  If these occur please seek reevaluation.  The clinical contact you with results of the  vaginal swab done today if positive.  Take Diflucan  for 1 dose which will help to treat the likely antibiotic induced yeast infection that you are having.  Follow-up with your PCP as you need to.  Please go to the ER if you develop any worsening symptoms.  Hope you feel better soon!     ED Prescriptions     Medication Sig Dispense Auth. Provider   fluconazole  (DIFLUCAN ) 150 MG tablet Take 1 tablet (150 mg total) by mouth daily. 1 tablet Anaise Sterbenz, Jodi R, NP      PDMP not reviewed this encounter.   Loreda Myla SAUNDERS, NP 12/18/23 [phone]

## 2023-12-19 LAB — CERVICOVAGINAL ANCILLARY ONLY
Bacterial Vaginitis (gardnerella): NEGATIVE
Candida Glabrata: NEGATIVE
Candida Vaginitis: POSITIVE — AB
Comment: NEGATIVE
Comment: NEGATIVE
Comment: NEGATIVE
